# Patient Record
Sex: Female | Born: 1950 | Race: White | Hispanic: No | Smoking: Never smoker
Health system: Southern US, Community
[De-identification: ages and names within clinical notes are randomized; demographics above are authoritative.]

## PROBLEM LIST (undated history)

## (undated) DIAGNOSIS — N182 Chronic kidney disease, stage 2 (mild): Secondary | ICD-10-CM

## (undated) DIAGNOSIS — T7840XA Allergy, unspecified, initial encounter: Secondary | ICD-10-CM

## (undated) DIAGNOSIS — I10 Essential (primary) hypertension: Secondary | ICD-10-CM

## (undated) DIAGNOSIS — K8020 Calculus of gallbladder without cholecystitis without obstruction: Secondary | ICD-10-CM

## (undated) DIAGNOSIS — D259 Leiomyoma of uterus, unspecified: Secondary | ICD-10-CM

## (undated) DIAGNOSIS — C4491 Basal cell carcinoma of skin, unspecified: Secondary | ICD-10-CM

## (undated) DIAGNOSIS — R112 Nausea with vomiting, unspecified: Secondary | ICD-10-CM

## (undated) DIAGNOSIS — Z9889 Other specified postprocedural states: Secondary | ICD-10-CM

## (undated) DIAGNOSIS — O3410 Maternal care for benign tumor of corpus uteri, unspecified trimester: Secondary | ICD-10-CM

## (undated) DIAGNOSIS — K7689 Other specified diseases of liver: Secondary | ICD-10-CM

## (undated) DIAGNOSIS — I4891 Unspecified atrial fibrillation: Secondary | ICD-10-CM

## (undated) DIAGNOSIS — N200 Calculus of kidney: Secondary | ICD-10-CM

## (undated) DIAGNOSIS — I341 Nonrheumatic mitral (valve) prolapse: Secondary | ICD-10-CM

## (undated) DIAGNOSIS — H269 Unspecified cataract: Secondary | ICD-10-CM

## (undated) HISTORY — DX: Allergy, unspecified, initial encounter: T78.40XA

## (undated) HISTORY — PX: POLYPECTOMY: SHX149

## (undated) HISTORY — PX: SQUAMOUS CELL CARCINOMA EXCISION: SHX2433

## (undated) HISTORY — DX: Unspecified cataract: H26.9

## (undated) HISTORY — PX: MOHS SURGERY: SUR867

## (undated) HISTORY — DX: Nonrheumatic mitral (valve) prolapse: I34.1

## (undated) HISTORY — DX: Other specified postprocedural states: R11.2

## (undated) HISTORY — DX: Calculus of kidney: N20.0

## (undated) HISTORY — DX: Other specified postprocedural states: Z98.890

## (undated) HISTORY — DX: Calculus of gallbladder without cholecystitis without obstruction: K80.20

## (undated) HISTORY — DX: Basal cell carcinoma of skin, unspecified: C44.91

## (undated) HISTORY — DX: Unspecified atrial fibrillation: I48.91

## (undated) HISTORY — DX: Chronic kidney disease, stage 2 (mild): N18.2

## (undated) HISTORY — DX: Leiomyoma of uterus, unspecified: D25.9

## (undated) HISTORY — DX: Leiomyoma of uterus, unspecified: O34.10

## (undated) HISTORY — PX: OTHER SURGICAL HISTORY: SHX169

## (undated) HISTORY — DX: Other specified diseases of liver: K76.89

## (undated) HISTORY — DX: Essential (primary) hypertension: I10

---
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
## 1996-09-20 HISTORY — PX: CHOLECYSTECTOMY: SHX55

## 2005-10-15 ENCOUNTER — Encounter: Admission: RE | Admit: 2005-10-15 | Discharge: 2005-10-15 | Payer: Self-pay | Admitting: Family Medicine

## 2006-10-21 ENCOUNTER — Encounter: Admission: RE | Admit: 2006-10-21 | Discharge: 2006-10-21 | Payer: Self-pay | Admitting: Family Medicine

## 2008-02-02 ENCOUNTER — Encounter: Admission: RE | Admit: 2008-02-02 | Discharge: 2008-02-02 | Payer: Self-pay | Admitting: Family Medicine

## 2009-04-14 ENCOUNTER — Encounter: Admission: RE | Admit: 2009-04-14 | Discharge: 2009-04-14 | Payer: Self-pay | Admitting: Obstetrics and Gynecology

## 2010-05-15 ENCOUNTER — Encounter: Admission: RE | Admit: 2010-05-15 | Discharge: 2010-05-15 | Payer: Self-pay | Admitting: Obstetrics and Gynecology

## 2011-03-26 ENCOUNTER — Encounter: Payer: Self-pay | Admitting: Emergency Medicine

## 2011-03-26 ENCOUNTER — Ambulatory Visit (INDEPENDENT_AMBULATORY_CARE_PROVIDER_SITE_OTHER): Payer: BC Managed Care – PPO | Admitting: Emergency Medicine

## 2011-03-26 DIAGNOSIS — I10 Essential (primary) hypertension: Secondary | ICD-10-CM

## 2011-03-26 DIAGNOSIS — I341 Nonrheumatic mitral (valve) prolapse: Secondary | ICD-10-CM | POA: Insufficient documentation

## 2011-03-26 DIAGNOSIS — R053 Chronic cough: Secondary | ICD-10-CM | POA: Insufficient documentation

## 2011-03-26 DIAGNOSIS — I059 Rheumatic mitral valve disease, unspecified: Secondary | ICD-10-CM

## 2011-03-26 DIAGNOSIS — R05 Cough: Secondary | ICD-10-CM | POA: Insufficient documentation

## 2011-03-26 NOTE — Assessment & Plan Note (Signed)
Resolving off benazepril. Denies GERD or allergies. Suspect that it will resolve completely - Wear a mask with wood working and yard work - if cough returns we will consider PFT and further imaging - consider empiric rx for GER dor allergies if cough returns.

## 2011-03-26 NOTE — Progress Notes (Signed)
  Subjective:    Patient ID: Melissa Cole, female    DOB: 02-28-1951, 60 y.o.   MRN: [id number]  HPI 60 yo woman, never smoker, hx HTN, MVP. She has been well until mid April when she was exposed to wood dust (she is an Tree surgeon, does wood turning) then did [employer]. She experienced a rash as well, pruritis. She described burning with inspiration that lasted for 3 -4 days, lost her voice. Minimal cough, no mucous. She developed a cough over the next several days. Has continued to cough, dry, bothers her with exertion, not w sleep. She has not done any more woodwork since; she has done some yardwork since. Was treated with pred by derm and family med. Of note she was on benazepril at the time - recently changed back to maxzide. Her cough is improving but not quite gone. She denies any allergy sx or GERD.    Review of Systems  Respiratory: Positive for cough.   Skin: Positive for rash.       Objective:   Physical Exam  Gen: Pleasant, well-nourished, in no distress,  normal affect  ENT: No lesions,  mouth clear,  oropharynx clear, no postnasal drip  Neck: No JVD, no TMG, no carotid bruits  Lungs: No use of accessory muscles, no dullness to percussion, clear without rales or rhonchi  Cardiovascular: RRR, heart sounds normal, no murmur or gallops, no peripheral edema  Musculoskeletal: No deformities, no cyanosis or clubbing  Neuro: alert, non focal  Skin: Warm, no lesions or rashes      Past Medical History  Diagnosis Date  . Hypertension   . Mitral valve prolapse      Family History  Problem Relation Age of Onset  . Heart attack Mother   . Heart attack Brother   . Heart attack Brother   . Stomach cancer Maternal Grandmother   . Allergies Mother     food sensitivities     History   Social History  . Marital Status: Unknown    Spouse Name: N/A    Number of Children: N/A  . Years of Education: N/A   Occupational History  . dental assistant    Social History Main  Topics  . Smoking status: Never Smoker   . Smokeless tobacco: Not on file  . Alcohol Use: Yes     4-5 drinks per week  . Drug Use: No  . Sexually Active: Not on file   Other Topics Concern  . Not on file   Social History Narrative  . No narrative on file     Allergies  Allergen Reactions  . Codeine     Stomach pain   . Iodine Itching     No outpatient prescriptions prior to visit.      Assessment & Plan:  Chronic cough Resolving off benazepril. Denies GERD or allergies. Suspect that it will resolve completely - Wear a mask with wood working and yard work - if cough returns we will consider PFT and further imaging - consider empiric rx for GER dor allergies if cough returns.

## 2011-03-26 NOTE — Patient Instructions (Signed)
Wear a mask with wood working and yard work If your cough returns we will consider further imaging of the chest and breathing tests Call our office if your cough worsens or for any breathing issues.

## 2011-04-12 ENCOUNTER — Other Ambulatory Visit: Payer: Self-pay | Admitting: Chiropractic Medicine

## 2011-04-12 DIAGNOSIS — M25511 Pain in right shoulder: Secondary | ICD-10-CM

## 2011-04-13 ENCOUNTER — Other Ambulatory Visit: Payer: BC Managed Care – PPO

## 2011-04-27 ENCOUNTER — Other Ambulatory Visit: Payer: Self-pay | Admitting: Chiropractic Medicine

## 2011-04-27 DIAGNOSIS — Z139 Encounter for screening, unspecified: Secondary | ICD-10-CM

## 2011-04-28 ENCOUNTER — Other Ambulatory Visit: Payer: BC Managed Care – PPO

## 2011-04-28 ENCOUNTER — Ambulatory Visit
Admission: RE | Admit: 2011-04-28 | Discharge: 2011-04-28 | Disposition: A | Discharge status: Home / Self Care | Payer: BC Managed Care – PPO | Source: Ambulatory Visit | Attending: Chiropractic Medicine | Admitting: Chiropractic Medicine

## 2011-04-28 DIAGNOSIS — M25511 Pain in right shoulder: Secondary | ICD-10-CM

## 2011-04-28 DIAGNOSIS — Z139 Encounter for screening, unspecified: Secondary | ICD-10-CM

## 2011-06-30 ENCOUNTER — Other Ambulatory Visit: Payer: Self-pay | Admitting: Obstetrics and Gynecology

## 2011-06-30 DIAGNOSIS — Z1231 Encounter for screening mammogram for malignant neoplasm of breast: Secondary | ICD-10-CM

## 2011-07-16 ENCOUNTER — Ambulatory Visit
Admission: RE | Admit: 2011-07-16 | Discharge: 2011-07-16 | Disposition: A | Discharge status: Home / Self Care | Payer: BC Managed Care – PPO | Source: Ambulatory Visit | Attending: Obstetrics and Gynecology | Admitting: Obstetrics and Gynecology

## 2011-07-16 DIAGNOSIS — Z1231 Encounter for screening mammogram for malignant neoplasm of breast: Secondary | ICD-10-CM

## 2011-10-29 ENCOUNTER — Emergency Department (HOSPITAL_COMMUNITY): Payer: BC Managed Care – PPO

## 2011-10-29 ENCOUNTER — Emergency Department (HOSPITAL_COMMUNITY)
Admission: EM | Admit: 2011-10-29 | Discharge: 2011-10-29 | Disposition: A | Discharge status: Home / Self Care | Payer: BC Managed Care – PPO | Attending: Emergency Medicine | Admitting: Emergency Medicine

## 2011-10-29 ENCOUNTER — Encounter (HOSPITAL_COMMUNITY): Payer: Self-pay | Admitting: Emergency Medicine

## 2011-10-29 DIAGNOSIS — S60229A Contusion of unspecified hand, initial encounter: Secondary | ICD-10-CM | POA: Insufficient documentation

## 2011-10-29 DIAGNOSIS — W19XXXA Unspecified fall, initial encounter: Secondary | ICD-10-CM

## 2011-10-29 DIAGNOSIS — M25539 Pain in unspecified wrist: Secondary | ICD-10-CM | POA: Insufficient documentation

## 2011-10-29 DIAGNOSIS — W010XXA Fall on same level from slipping, tripping and stumbling without subsequent striking against object, initial encounter: Secondary | ICD-10-CM | POA: Insufficient documentation

## 2011-10-29 DIAGNOSIS — I10 Essential (primary) hypertension: Secondary | ICD-10-CM | POA: Insufficient documentation

## 2011-10-29 DIAGNOSIS — M7989 Other specified soft tissue disorders: Secondary | ICD-10-CM | POA: Insufficient documentation

## 2011-10-29 DIAGNOSIS — M79609 Pain in unspecified limb: Secondary | ICD-10-CM | POA: Insufficient documentation

## 2011-10-29 DIAGNOSIS — Z79899 Other long term (current) drug therapy: Secondary | ICD-10-CM | POA: Insufficient documentation

## 2011-10-29 MED ORDER — FENTANYL CITRATE 0.05 MG/ML IJ SOLN
INTRAMUSCULAR | Status: AC
  Administered 2011-10-29: 50 ug via INTRAVENOUS
  Filled 2011-10-29: qty 2

## 2011-10-29 MED ORDER — HYDROMORPHONE HCL PF 1 MG/ML IJ SOLN
INTRAMUSCULAR | Status: AC
  Administered 2011-10-29: 1 mg via INTRAVENOUS
  Filled 2011-10-29: qty 1

## 2011-10-29 MED ORDER — PROMETHAZINE HCL 25 MG/ML IJ SOLN: 12.5000 mg | INTRAMUSCULAR | Status: DC | PRN

## 2011-10-29 MED ORDER — ONDANSETRON HCL 4 MG/2ML IJ SOLN
4.0000 mg | Freq: Once | INTRAMUSCULAR | Status: AC
  Administered 2011-10-29: 4 mg via INTRAVENOUS

## 2011-10-29 MED ORDER — ONDANSETRON HCL 4 MG/2ML IJ SOLN
INTRAMUSCULAR | Status: AC
  Filled 2011-10-29: qty 2

## 2011-10-29 MED ORDER — FENTANYL CITRATE 0.05 MG/ML IJ SOLN
100.0000 ug | Freq: Once | INTRAMUSCULAR | Status: AC
  Administered 2011-10-29: 50 ug via INTRAVENOUS

## 2011-10-29 MED ORDER — ONDANSETRON HCL 4 MG/2ML IJ SOLN
INTRAMUSCULAR | Status: AC
  Administered 2011-10-29: 4 mg via INTRAVENOUS
  Filled 2011-10-29: qty 2

## 2011-10-29 MED ORDER — HYDROMORPHONE HCL PF 1 MG/ML IJ SOLN
1.0000 mg | Freq: Once | INTRAMUSCULAR | Status: AC
  Administered 2011-10-29: 1 mg via INTRAVENOUS

## 2011-10-29 MED ORDER — PROMETHAZINE HCL 25 MG/ML IJ SOLN
12.5000 mg | INTRAMUSCULAR | Status: AC
  Administered 2011-10-29: 12.5 mg via INTRAVENOUS
  Filled 2011-10-29: qty 1

## 2011-10-29 MED ORDER — OXYCODONE-ACETAMINOPHEN 5-325 MG PO TABS: 2.0000 | ORAL_TABLET | ORAL | Status: AC | PRN

## 2011-10-29 MED ORDER — ONDANSETRON HCL 4 MG PO TABS: 4.0000 mg | ORAL_TABLET | Freq: Four times a day (QID) | ORAL | Status: AC

## 2011-10-29 NOTE — ED Notes (Signed)
Pt c/o left wrist and arm pain after tripping today; pt sts heard crack; swelling noted

## 2011-10-29 NOTE — ED Provider Notes (Signed)
History     CSN: [id number]  Arrival date & time 10/29/11  1631   First MD Initiated Contact with Patient 10/29/11 1708      Chief Complaint  Patient presents with  . Wrist Injury  . Fall     HPI Pt c/o left wrist and arm pain after tripping today; pt sts heard crack; swelling noted     Past Medical History  Diagnosis Date  . Hypertension   . Mitral valve prolapse     Past Surgical History  Procedure Date  . Cholecystectomy 1998  . Mitral valve repair     Family History  Problem Relation Age of Onset  . Heart attack Mother   . Heart attack Brother   . Heart attack Brother   . Stomach cancer Maternal Grandmother   . Allergies Mother     food sensitivities    History  Substance Use Topics  . Smoking status: Never Smoker   . Smokeless tobacco: Not on file  . Alcohol Use: Yes     4-5 drinks per week    OB History    Grav Para Term Preterm Abortions TAB SAB Ect Mult Living                  Review of Systems Negative except as noted on history of present illness Allergies  Codeine; Iodine; and Mushroom ext cmplx(shiitake-reishi-mait)  Home Medications   Current Outpatient Rx  Name Route Sig Dispense Refill  . ACTIVELLA 1-0.5 MG PO TABS Oral Take 1 tablet by mouth daily. Once daily    . OMEGA-3 FATTY ACIDS 1000 MG PO CAPS Oral Take 2 g by mouth daily. 2 by mouth daily    . POTASSIUM 99 MG PO TABS Oral Take 1 tablet by mouth daily as needed. As needed    . TRIAMTERENE-HCTZ 75-50 MG PO TABS Oral Take 1 tablet by mouth daily. Once daily    . ONDANSETRON HCL 4 MG PO TABS Oral Take 1 tablet (4 mg total) by mouth every 6 (six) hours. 12 tablet 0  . OXYCODONE-ACETAMINOPHEN 5-325 MG PO TABS Oral Take 2 tablets by mouth every 4 (four) hours as needed for pain. 20 tablet 0    BP 170/106  Pulse 115  Temp(Src) 98.1 F (36.7 C) (Oral)  Resp 18  SpO2 98%  Physical Exam  Nursing note and vitals reviewed. Constitutional: She is oriented to person, place, and  time. She appears well-developed and well-nourished. No distress.  HENT:  Head: Normocephalic and atraumatic.  Eyes: Pupils are equal, round, and reactive to light.  Neck: Normal range of motion.  Cardiovascular: Normal rate and intact distal pulses.   Pulmonary/Chest: No respiratory distress.  Abdominal: Normal appearance. She exhibits no distension.  Musculoskeletal:       Left hand: She exhibits decreased range of motion, tenderness, bony tenderness and swelling. She exhibits no deformity and no laceration.       Hands: Neurological: She is alert and oriented to person, place, and time. No cranial nerve deficit.  Skin: Skin is warm and dry. No rash noted.  Psychiatric: She has a normal mood and affect. Her behavior is normal.    ED Course  Procedures (including critical care time)  Labs Reviewed - No data to display Dg Wrist Complete Left  10/29/2011  *RADIOLOGY REPORT*  Clinical Data: Wrist injury post fall  LEFT WRIST - COMPLETE 3+ VIEW  Comparison: None.  Findings: Four views of the left wrist submitted.  No  acute fracture or subluxation.  Probable accessory ossicle noted adjacent to ulnar styloid.  Mild degenerative narrowing of radiocarpal joint space.  IMPRESSION: No acute fracture or subluxation.  Mild degenerative changes.  Original Report Authenticated By: Natasha Mead, M.D.   Dg Hand Complete Left  10/29/2011  *RADIOLOGY REPORT*  Clinical Data: Wrist injury post fall  LEFT HAND - COMPLETE 3+ VIEW  Comparison: None  Findings: Three views of the left hand submitted.  No acute fracture or subluxation.  Degenerative changes are noted distal interphalangeal joints.  IMPRESSION: No acute fracture or subluxation.  Degenerative changes distal interphalangeal joints.  Original Report Authenticated By: Natasha Mead, M.D.     1. Fall   2. Hand contusion       MDM          Nelia Shi, MD 10/29/11 [phone]

## 2011-10-29 NOTE — ED Notes (Signed)
Attempted to get patient up to the bathroom.  Became nauseated and laid back down.

## 2011-10-29 NOTE — Progress Notes (Signed)
Orthopedic Tech Progress Note Patient Details:  Melissa Cole 01-15-1951 [id number]  Type of Splint: Other (comment) (splint wrist lt/rt) Splint Location: left wrist Splint Interventions: Application  velcro wrist splint  Nikki Dom 10/29/2011, 7:42 PM

## 2011-10-29 NOTE — ED Notes (Signed)
Asked provider for pain medication order, pt requesting medication other than dilaudid as she had N/V with original dose

## 2011-10-29 NOTE — ED Notes (Addendum)
Pt got tangled in dog leash and fell forward catching self with hands.  Heard wrist pop and wrist was bent forward toward palm.  Pt snapped hand back into place.  Pulses 2+ fingers warm and sensation normal

## 2012-07-24 ENCOUNTER — Other Ambulatory Visit: Payer: Self-pay | Admitting: Obstetrics and Gynecology

## 2012-07-24 DIAGNOSIS — Z1231 Encounter for screening mammogram for malignant neoplasm of breast: Secondary | ICD-10-CM

## 2012-07-28 ENCOUNTER — Ambulatory Visit
Admission: RE | Admit: 2012-07-28 | Discharge: 2012-07-28 | Disposition: A | Discharge status: Home / Self Care | Payer: BC Managed Care – PPO | Source: Ambulatory Visit | Attending: Obstetrics and Gynecology | Admitting: Obstetrics and Gynecology

## 2012-07-28 DIAGNOSIS — Z1231 Encounter for screening mammogram for malignant neoplasm of breast: Secondary | ICD-10-CM

## 2013-08-10 ENCOUNTER — Other Ambulatory Visit: Payer: Self-pay

## 2013-08-10 DIAGNOSIS — Z1231 Encounter for screening mammogram for malignant neoplasm of breast: Secondary | ICD-10-CM

## 2013-09-12 ENCOUNTER — Ambulatory Visit
Admission: RE | Admit: 2013-09-12 | Discharge: 2013-09-12 | Disposition: A | Discharge status: Home / Self Care | Payer: BC Managed Care – PPO | Source: Ambulatory Visit

## 2013-09-12 DIAGNOSIS — Z1231 Encounter for screening mammogram for malignant neoplasm of breast: Secondary | ICD-10-CM

## 2014-01-23 ENCOUNTER — Emergency Department (HOSPITAL_COMMUNITY)
Admission: EM | Admit: 2014-01-23 | Discharge: 2014-01-24 | Disposition: A | Discharge status: Home / Self Care | Payer: BC Managed Care – PPO | Attending: Emergency Medicine | Admitting: Emergency Medicine

## 2014-01-23 ENCOUNTER — Encounter (HOSPITAL_COMMUNITY): Payer: Self-pay | Admitting: Emergency Medicine

## 2014-01-23 DIAGNOSIS — I10 Essential (primary) hypertension: Secondary | ICD-10-CM | POA: Insufficient documentation

## 2014-01-23 DIAGNOSIS — N200 Calculus of kidney: Secondary | ICD-10-CM | POA: Insufficient documentation

## 2014-01-23 DIAGNOSIS — Z79899 Other long term (current) drug therapy: Secondary | ICD-10-CM | POA: Insufficient documentation

## 2014-01-23 MED ORDER — ONDANSETRON HCL 4 MG/2ML IJ SOLN
4.0000 mg | Freq: Once | INTRAMUSCULAR | Status: AC
  Administered 2014-01-24: 4 mg via INTRAVENOUS
  Filled 2014-01-23: qty 2

## 2014-01-23 MED ORDER — MORPHINE SULFATE 4 MG/ML IJ SOLN
4.0000 mg | Freq: Once | INTRAMUSCULAR | Status: AC
  Administered 2014-01-24: 4 mg via INTRAVENOUS
  Filled 2014-01-23: qty 1

## 2014-01-23 NOTE — ED Notes (Signed)
Pt stated that she took tylenol at about 9pm.

## 2014-01-23 NOTE — ED Notes (Signed)
Pt was assessed with a C/O abdominal pain,a scale of 10 (0-10). Vs 153./72 Sp02 99% P86, alert and orientedx4.

## 2014-01-24 ENCOUNTER — Emergency Department (HOSPITAL_COMMUNITY): Payer: BC Managed Care – PPO

## 2014-01-24 ENCOUNTER — Encounter (HOSPITAL_COMMUNITY): Payer: Self-pay | Admitting: Radiology

## 2014-01-24 LAB — COMPREHENSIVE METABOLIC PANEL
ALBUMIN: 3.8 g/dL (ref 3.5–5.2)
ALT: 34 U/L (ref 0–35)
AST: 25 U/L (ref 0–37)
Alkaline Phosphatase: 42 U/L (ref 39–117)
BILIRUBIN TOTAL: 0.3 mg/dL (ref 0.3–1.2)
BUN: 17 mg/dL (ref 6–23)
CO2: 25 mEq/L (ref 19–32)
CREATININE: 1.02 mg/dL (ref 0.50–1.10)
Calcium: 9.2 mg/dL (ref 8.4–10.5)
Chloride: 101 mEq/L (ref 96–112)
GFR calc Af Amer: 67 mL/min — ABNORMAL LOW (ref >90)
GFR calc non Af Amer: 58 mL/min — ABNORMAL LOW (ref >90)
GLUCOSE: 124 mg/dL — AB (ref 70–99)
Potassium: 3.5 mEq/L — ABNORMAL LOW (ref 3.7–5.3)
Sodium: 138 mEq/L (ref 137–147)
TOTAL PROTEIN: 6.8 g/dL (ref 6.0–8.3)

## 2014-01-24 LAB — URINE MICROSCOPIC-ADD ON

## 2014-01-24 LAB — CBC WITH DIFFERENTIAL/PLATELET
BASOS PCT: 0 % (ref 0–1)
Basophils Absolute: 0 10*3/uL (ref 0.0–0.1)
EOS PCT: 0 % (ref 0–5)
Eosinophils Absolute: 0 10*3/uL (ref 0.0–0.7)
HCT: 39.2 % (ref 36.0–46.0)
Hemoglobin: 13.5 g/dL (ref 12.0–15.0)
Lymphocytes Relative: 12 % (ref 12–46)
Lymphs Abs: 1.1 10*3/uL (ref 0.7–4.0)
MCH: 31.5 pg (ref 26.0–34.0)
MCHC: 34.4 g/dL (ref 30.0–36.0)
MCV: 91.6 fL (ref 78.0–100.0)
MONO ABS: 0.5 10*3/uL (ref 0.1–1.0)
Monocytes Relative: 5 % (ref 3–12)
Neutro Abs: 7.9 10*3/uL — ABNORMAL HIGH (ref 1.7–7.7)
Neutrophils Relative %: 83 % — ABNORMAL HIGH (ref 43–77)
Platelets: 234 10*3/uL (ref 150–400)
RBC: 4.28 MIL/uL (ref 3.87–5.11)
RDW: 12.6 % (ref 11.5–15.5)
WBC: 9.6 10*3/uL (ref 4.0–10.5)

## 2014-01-24 LAB — URINALYSIS, ROUTINE W REFLEX MICROSCOPIC
Bilirubin Urine: NEGATIVE
Glucose, UA: NEGATIVE mg/dL
Hgb urine dipstick: NEGATIVE
KETONES UR: 15 mg/dL — AB
NITRITE: NEGATIVE
PH: 7 (ref 5.0–8.0)
Protein, ur: NEGATIVE mg/dL
SPECIFIC GRAVITY, URINE: 1.017 (ref 1.005–1.030)
Urobilinogen, UA: 0.2 mg/dL (ref 0.0–1.0)

## 2014-01-24 LAB — LIPASE, BLOOD: Lipase: 31 U/L (ref 11–59)

## 2014-01-24 MED ORDER — ONDANSETRON 4 MG PO TBDP
8.0000 mg | ORAL_TABLET | Freq: Once | ORAL | Status: AC
  Administered 2014-01-24: 8 mg via ORAL
  Filled 2014-01-24: qty 2

## 2014-01-24 MED ORDER — IOHEXOL 300 MG/ML  SOLN
25.0000 mL | Freq: Once | INTRAMUSCULAR | Status: AC | PRN
  Administered 2014-01-24: 25 mL via ORAL

## 2014-01-24 MED ORDER — TAMSULOSIN HCL 0.4 MG PO CAPS: 0.4000 mg | ORAL_CAPSULE | Freq: Every day | ORAL | Status: DC

## 2014-01-24 MED ORDER — SULFAMETHOXAZOLE-TMP DS 800-160 MG PO TABS: 1.0000 | ORAL_TABLET | Freq: Two times a day (BID) | ORAL | Status: DC

## 2014-01-24 MED ORDER — OXYCODONE-ACETAMINOPHEN 5-325 MG PO TABS: 2.0000 | ORAL_TABLET | ORAL | Status: DC | PRN

## 2014-01-24 MED ORDER — SODIUM CHLORIDE 0.9 % IV BOLUS (SEPSIS)
1000.0000 mL | Freq: Once | INTRAVENOUS | Status: AC
  Administered 2014-01-24: 1000 mL via INTRAVENOUS

## 2014-01-24 MED ORDER — PANTOPRAZOLE SODIUM 40 MG PO TBEC: 40.0000 mg | DELAYED_RELEASE_TABLET | Freq: Every day | ORAL | Status: DC

## 2014-01-24 MED ORDER — MORPHINE SULFATE 4 MG/ML IJ SOLN
4.0000 mg | Freq: Once | INTRAMUSCULAR | Status: AC
  Administered 2014-01-24: 4 mg via INTRAVENOUS
  Filled 2014-01-24: qty 1

## 2014-01-24 MED ORDER — ONDANSETRON HCL 4 MG/2ML IJ SOLN
4.0000 mg | Freq: Once | INTRAMUSCULAR | Status: AC
  Administered 2014-01-24: 4 mg via INTRAVENOUS
  Filled 2014-01-24: qty 2

## 2014-01-24 MED ORDER — IOHEXOL 300 MG/ML  SOLN
100.0000 mL | Freq: Once | INTRAMUSCULAR | Status: AC | PRN
  Administered 2014-01-24: 100 mL via INTRAVENOUS

## 2014-01-24 MED ORDER — OXYCODONE-ACETAMINOPHEN 5-325 MG PO TABS
2.0000 | ORAL_TABLET | Freq: Once | ORAL | Status: AC
  Administered 2014-01-24: 2 via ORAL
  Filled 2014-01-24: qty 2

## 2014-01-24 MED ORDER — ONDANSETRON 8 MG PO TBDP: 8.0000 mg | ORAL_TABLET | Freq: Three times a day (TID) | ORAL | Status: DC | PRN

## 2014-01-24 NOTE — ED Notes (Signed)
Returned from CT.

## 2014-01-24 NOTE — ED Provider Notes (Signed)
CSN: [id number]     Arrival date & time 01/23/14  2242 History   First MD Initiated Contact with Patient 01/23/14 2254     Chief Complaint  Patient presents with  . Abdominal Pain     (Consider location/radiation/quality/duration/timing/severity/associated sxs/prior Treatment) HPI 63 year old female presents to emergency room from home with acute onset of left sided abdominal pain with radiation throughout her entire abdomen starting at 9 PM.  Pain is sharp constant crampy in nature.  No prior history of same.  Patient had gallbladder removed in 98, no other prior surgeries.  She reports nausea vomiting and diarrhea.  No fevers. Past Medical History  Diagnosis Date  . Hypertension   . Mitral valve prolapse    Past Surgical History  Procedure Laterality Date  . Cholecystectomy  1998  . Mitral valve repair     Family History  Problem Relation Age of Onset  . Heart attack Mother   . Heart attack Brother   . Heart attack Brother   . Stomach cancer Maternal Grandmother   . Allergies Mother     food sensitivities   History  Substance Use Topics  . Smoking status: Never Smoker   . Smokeless tobacco: Not on file  . Alcohol Use: Yes     Comment: 4-5 drinks per week   OB History   Grav Para Term Preterm Abortions TAB SAB Ect Mult Living                 Review of Systems   See History of Present Illness; otherwise all other systems are reviewed and negative  Allergies  Codeine; Iodine; and Mushroom ext cmplx(shiitake-reishi-mait)  Home Medications   Prior to Admission medications   Medication Sig Start Date End Date Taking? Authorizing Provider  ACTIVELLA 1-0.5 MG per tablet Take 1 tablet by mouth daily. Once daily 03/18/11  Yes Historical Provider, MD  Coenzyme Q10 (COQ-10 PO) Take 1 tablet by mouth daily.   Yes Historical Provider, MD  OVER THE COUNTER MEDICATION Take 1 capsule by mouth daily. Ultimate Oil (fish oil supplement)   Yes Historical Provider, MD  pantoprazole  (PROTONIX) 40 MG tablet Take 40 mg by mouth daily.   Yes Historical Provider, MD  triamterene-hydrochlorothiazide (MAXZIDE) 75-50 MG per tablet Take 1 tablet by mouth daily. Once daily 03/19/11  Yes Historical Provider, MD  Vitamin D, Cholecalciferol, 400 UNITS CHEW Chew 1 tablet by mouth daily.   Yes Historical Provider, MD   BP 153/72  Pulse 86  Temp(Src) 98.6 F (37 C) (Oral)  Ht 5\' 4"  (1.626 m)  Wt 160 lb (72.576 kg)  BMI 27.45 kg/m2  SpO2 99% Physical Exam  Nursing note and vitals reviewed. Constitutional: She is oriented to person, place, and time. She appears well-developed and well-nourished. She appears distressed.  HENT:  Head: Normocephalic and atraumatic.  Nose: Nose normal.  Mouth/Throat: Oropharynx is clear and moist.  Eyes: Conjunctivae and EOM are normal. Pupils are equal, round, and reactive to light.  Neck: Normal range of motion. Neck supple. No JVD present. No tracheal deviation present. No thyromegaly present.  Cardiovascular: Normal rate, regular rhythm, normal heart sounds and intact distal pulses.  Exam reveals no gallop and no friction rub.   No murmur heard. Pulmonary/Chest: Effort normal and breath sounds normal. No stridor. No respiratory distress. She has no wheezes. She has no rales. She exhibits no tenderness.  Abdominal: Soft. Bowel sounds are normal. She exhibits no distension and no mass. There is tenderness (patient  has significant tenderness in epigastrium along left abdominal region.  No rebound no guarding). There is no rebound and no guarding.  Musculoskeletal: Normal range of motion. She exhibits no edema and no tenderness.  Lymphadenopathy:    She has no cervical adenopathy.  Neurological: She is alert and oriented to person, place, and time. She exhibits normal muscle tone. Coordination normal.  Skin: Skin is warm and dry. No rash noted. No erythema. No pallor.  Psychiatric: She has a normal mood and affect. Her behavior is normal. Judgment and  thought content normal.    ED Course  Procedures (including critical care time) Labs Review Labs Reviewed  CBC WITH DIFFERENTIAL - Abnormal; Notable for the following:    Neutrophils Relative % 83 (*)    Neutro Abs 7.9 (*)    All other components within normal limits  COMPREHENSIVE METABOLIC PANEL - Abnormal; Notable for the following:    Potassium 3.5 (*)    Glucose, Bld 124 (*)    GFR calc non Af Amer 58 (*)    GFR calc Af Amer 67 (*)    All other components within normal limits  URINALYSIS, ROUTINE W REFLEX MICROSCOPIC - Abnormal; Notable for the following:    Ketones, ur 15 (*)    Leukocytes, UA MODERATE (*)    All other components within normal limits  URINE CULTURE  LIPASE, BLOOD  URINE MICROSCOPIC-ADD ON    Imaging Review Ct Abdomen Pelvis W Contrast  01/24/2014   CLINICAL DATA:  Abdominal pain, left-sided, acute onset  EXAM: CT ABDOMEN AND PELVIS WITH CONTRAST  TECHNIQUE: Multidetector CT imaging of the abdomen and pelvis was performed using the standard protocol following bolus administration of intravenous contrast.  CONTRAST:  158mL OMNIPAQUE IOHEXOL 300 MG/ML  SOLN  COMPARISON:  None.  FINDINGS: Exam being read in incomplete status to accelerate patient care.  BODY WALL: Unremarkable.  LOWER CHEST: RCA atherosclerosis.  Dependent atelectasis, mild.  ABDOMEN/PELVIS:  Liver: 2 hepatic cysts, the larger in the central right liver measuring up did with 33 mm.  Biliary: Cholecystectomy.  Pancreas: Unremarkable.  Spleen: Unremarkable.  Adrenals: Unremarkable.  Kidneys and ureters: Moderate left hydroureteronephrosis secondary to a 2 mm stone in the distal left ureter, at the ureteral vesicular junction. There may be an additional stone at the base of the bladder. Moderate left perinephric edema, likely from fornixrupture. No intrarenal calculus. No evidence of solid renal mass.  Bladder: Possible punctate stone at the base of the bladder.  Reproductive: At least 3 uterine fibroids in  the fundus, 2 which are pedunculated. The fibroids measure approximately 1 cm.  Bowel: No obstruction. No pericecal inflammation.  Retroperitoneum: No mass or adenopathy.  Peritoneum: No free fluid or gas.  Vascular: No acute abnormality.  OSSEOUS: No acute abnormalities. Focally advanced L2-3 degenerative disc disease with disc protrusion narrowing the ventral thecal sac.  IMPRESSION: 1. Obstructing 2 mm stone at the left ureteral vesicular junction. 2. Small uterine fibroids.   Electronically Signed   By: Jorje Guild M.D.   On: 01/24/2014 05:13     EKG Interpretation None      MDM   Final diagnoses:  Kidney stone on left side    63 year old female with acute onset of left-sided abdominal pain radiation into her epigastrium.  The plan for labs, pain and nausea medicine.  May need CT scan, we'll let labs help with decision for imaging.    Kalman Drape, MD 01/24/14 [phone]

## 2014-01-24 NOTE — ED Notes (Signed)
Pt states that she feels better, to be d/c to home,pt called daughter to come pick her up. Pt alert and orientedx4

## 2014-01-24 NOTE — ED Notes (Signed)
Patient transported to CT 

## 2014-01-24 NOTE — Discharge Instructions (Signed)
You have a 2 mm stone in your left ureter, about to pass into your bladder.  It appears you have a second stone already passed into your bladder!  Take medications as prescribed.  Use strainer as instructed.  Contact the urology clinic today to scheduled follow up.  Stones less than 6 mm are usually able to pass on their own.     Kidney Stones Kidney stones (urolithiasis) are deposits that form inside your kidneys. The intense pain is caused by the stone moving through the urinary tract. When the stone moves, the ureter goes into spasm around the stone. The stone is usually passed in the urine.  CAUSES   A disorder that makes certain neck glands produce too much parathyroid hormone (primary hyperparathyroidism).  A buildup of uric acid crystals, similar to gout in your joints.  Narrowing (stricture) of the ureter.  A kidney obstruction present at birth (congenital obstruction).  Previous surgery on the kidney or ureters.  Numerous kidney infections. SYMPTOMS   Feeling sick to your stomach (nauseous).  Throwing up (vomiting).  Blood in the urine (hematuria).  Pain that usually spreads (radiates) to the groin.  Frequency or urgency of urination. DIAGNOSIS   Taking a history and physical exam.  Blood or urine tests.  CT scan.  Occasionally, an examination of the inside of the urinary bladder (cystoscopy) is performed. TREATMENT   Observation.  Increasing your fluid intake.  Extracorporeal shock wave lithotripsy This is a noninvasive procedure that uses shock waves to break up kidney stones.  Surgery may be needed if you have severe pain or persistent obstruction. There are various surgical procedures. Most of the procedures are performed with the use of small instruments. Only small incisions are needed to accommodate these instruments, so recovery time is minimized. The size, location, and chemical composition are all important variables that will determine the proper  choice of action for you. Talk to your health care provider to better understand your situation so that you will minimize the risk of injury to yourself and your kidney.  HOME CARE INSTRUCTIONS   Drink enough water and fluids to keep your urine clear or pale yellow. This will help you to pass the stone or stone fragments.  Strain all urine through the provided strainer. Keep all particulate matter and stones for your health care provider to see. The stone causing the pain may be as small as a grain of salt. It is very important to use the strainer each and every time you pass your urine. The collection of your stone will allow your health care provider to analyze it and verify that a stone has actually passed. The stone analysis will often identify what you can do to reduce the incidence of recurrences.  Only take over-the-counter or prescription medicines for pain, discomfort, or fever as directed by your health care provider.  Make a follow-up appointment with your health care provider as directed.  Get follow-up X-rays if required. The absence of pain does not always mean that the stone has passed. It may have only stopped moving. If the urine remains completely obstructed, it can cause loss of kidney function or even complete destruction of the kidney. It is your responsibility to make sure X-rays and follow-ups are completed. Ultrasounds of the kidney can show blockages and the status of the kidney. Ultrasounds are not associated with any radiation and can be performed easily in a matter of minutes. SEEK MEDICAL CARE IF:  You experience pain that is  progressive and unresponsive to any pain medicine you have been prescribed. SEEK IMMEDIATE MEDICAL CARE IF:   Pain cannot be controlled with the prescribed medicine.  You have a fever or shaking chills.  The severity or intensity of pain increases over 18 hours and is not relieved by pain medicine.  You develop a new onset of abdominal  pain.  You feel faint or pass out.  You are unable to urinate. MAKE SURE YOU:   Understand these instructions.  Will watch your condition.  Will get help right away if you are not doing well or get worse. Document Released: 09/06/2005 Document Revised: 05/09/2013 Document Reviewed: 02/07/2013 [facility] Patient Information 2014 [place].  Urine Strainer This strainer is used to catch or filter out any stones found in your urine. Place the strainer under your urine stream. Save any stones or objects that you find in your urine. Place them in a plastic or glass container to show your caregiver. The stones vary in size - some can be very small, so make sure you check the strainer carefully. Your caregiver may send the stone to the lab. When the results are back, your caregiver may recommend medicines or diet changes.  Document Released: 06/11/2004 Document Revised: 11/29/2011 Document Reviewed: 07/19/2008 [facility] Patient Information 2014 [place].

## 2014-01-24 NOTE — ED Notes (Signed)
Pt was d/c, requested Michelene Heady MD. For pain medication, 2 tablets of percocet and 8mg  of Zofran given PO after d/c.No distress noted. Pt d/c to family.

## 2014-01-25 LAB — URINE CULTURE
COLONY COUNT: NO GROWTH
CULTURE: NO GROWTH
SPECIAL REQUESTS: NORMAL

## 2014-08-09 ENCOUNTER — Other Ambulatory Visit: Payer: Self-pay

## 2014-08-09 DIAGNOSIS — Z1231 Encounter for screening mammogram for malignant neoplasm of breast: Secondary | ICD-10-CM

## 2014-08-30 ENCOUNTER — Encounter: Payer: Self-pay | Admitting: Internal Medicine

## 2014-09-16 ENCOUNTER — Ambulatory Visit
Admission: RE | Admit: 2014-09-16 | Discharge: 2014-09-16 | Disposition: A | Discharge status: Home / Self Care | Payer: BC Managed Care – PPO | Source: Ambulatory Visit

## 2014-09-16 DIAGNOSIS — Z1231 Encounter for screening mammogram for malignant neoplasm of breast: Secondary | ICD-10-CM

## 2014-09-18 ENCOUNTER — Other Ambulatory Visit: Payer: Self-pay | Admitting: Obstetrics and Gynecology

## 2014-09-18 DIAGNOSIS — R928 Other abnormal and inconclusive findings on diagnostic imaging of breast: Secondary | ICD-10-CM

## 2014-10-01 ENCOUNTER — Ambulatory Visit
Admission: RE | Admit: 2014-10-01 | Discharge: 2014-10-01 | Disposition: A | Discharge status: Home / Self Care | Payer: BC Managed Care – PPO | Source: Ambulatory Visit | Attending: Obstetrics and Gynecology | Admitting: Obstetrics and Gynecology

## 2014-10-01 DIAGNOSIS — R928 Other abnormal and inconclusive findings on diagnostic imaging of breast: Secondary | ICD-10-CM

## 2014-11-06 ENCOUNTER — Encounter: Payer: Self-pay | Admitting: *Deleted

## 2014-11-18 ENCOUNTER — Encounter: Payer: BC Managed Care – PPO | Admitting: Internal Medicine

## 2014-11-19 ENCOUNTER — Other Ambulatory Visit (INDEPENDENT_AMBULATORY_CARE_PROVIDER_SITE_OTHER): Payer: BC Managed Care – PPO

## 2014-11-19 ENCOUNTER — Ambulatory Visit (INDEPENDENT_AMBULATORY_CARE_PROVIDER_SITE_OTHER): Payer: BC Managed Care – PPO | Admitting: Internal Medicine

## 2014-11-19 ENCOUNTER — Encounter: Payer: Self-pay | Admitting: Internal Medicine

## 2014-11-19 VITALS — BP 150/90 | HR 96 | Ht 63.25 in | Wt 141.2 lb

## 2014-11-19 DIAGNOSIS — R0989 Other specified symptoms and signs involving the circulatory and respiratory systems: Secondary | ICD-10-CM

## 2014-11-19 DIAGNOSIS — Z1211 Encounter for screening for malignant neoplasm of colon: Secondary | ICD-10-CM

## 2014-11-19 DIAGNOSIS — F458 Other somatoform disorders: Secondary | ICD-10-CM

## 2014-11-19 LAB — IGA: IgA: 159 mg/dL (ref 68–378)

## 2014-11-19 NOTE — Patient Instructions (Addendum)
You have been scheduled for an endoscopy and colonoscopy. Please follow the written instructions given to you at your visit today. Please pick up your prep supplies at the pharmacy within the next 1-3 days. If you use inhalers (even only as needed), please bring them with you on the day of your procedure. Your physician has requested that you go to www.startemmi.com and enter the access code given to you at your visit today. This web site gives a general overview about your procedure. However, you should still follow specific instructions given to you by our office regarding your preparation for the procedure.  Your physician has requested that you go to the basement for the following lab work before leaving today: IgA, TTG  CC:Dr [employer]

## 2014-11-19 NOTE — Progress Notes (Signed)
Patient ID: Melissa Cole, female   DOB: 08-25-1951, 64 y.o.   MRN: [id number] HPI: Melissa Cole is a 64 yo female with PMH of kidney stones, hypertension, mitral valve prolapse who seen in consultation at the request of Dr. Joylene Draft to evaluate globus sensation and for colorectal cancer screening. She is here alone today. She reports that over 2 year she has been having the sensation that there is something in her lower neck anteriorly. This is just above the sternal notch. This is more noticeable after eating but without definite food trigger. She notices it more when she is still her in a quiet place. It seems to be better if she doesn't eat. She has a long-standing and chronic throat clearing that she feels is necessary to clear phlegm from her throat. She does not have heartburn, dysphagia or odynophagia. She took pantoprazole 40 mg daily for 6 months and this did not help the throat clearing or globus sensation. She's also tried stopping milk and all other medications which did not help either. She reports no abdominal pain. No nausea or vomiting. Good appetite. No early satiety. Normal bowel habits without blood in her stool or melena. No diarrhea or constipation. No family history of colorectal cancer, IBD. She had a colonoscopy 11 years ago in [place] which she reports was normal. She currently denies coughing and chest pain. In the summer of 2015 she developed a rash which she felt possibly was secondary to environmental allergy. This was associated with cough and eventually treated with steroids. The cough resolved entirely. She also has a history of gallstones and is status post cholecystectomy.  Past Medical History  Diagnosis Date  . Hypertension   . Mitral valve prolapse   . Uterine fibroids affecting pregnancy   . Hepatic cyst   . Gallstones   . Basal cell carcinoma   . Kidney stones     Past Surgical History  Procedure Laterality Date  . Cholecystectomy  1998  . Mitral valve  prolaps    . Mohs surgery      basal   . Squamous cell carcinoma excision      Outpatient Prescriptions Prior to Visit  Medication Sig Dispense Refill  . ACTIVELLA 1-0.5 MG per tablet Take 1 tablet by mouth 3 (three) times a week. Once daily    . Coenzyme Q10 (COQ-10 PO) Take 1 tablet by mouth daily. With Vitamins A & E    . OVER THE COUNTER MEDICATION Take 2 capsules by mouth daily. Ultimate Oil (fish oil supplement) 1280 mg    . triamterene-hydrochlorothiazide (MAXZIDE) 75-50 MG per tablet Take 0.5 tablets by mouth daily. Once daily    . Vitamin D, Cholecalciferol, 400 UNITS CHEW Chew 2 tablets by mouth daily.     . ondansetron (ZOFRAN ODT) 8 MG disintegrating tablet Take 1 tablet (8 mg total) by mouth every 8 (eight) hours as needed for nausea or vomiting. 20 tablet 0  . oxyCODONE-acetaminophen (PERCOCET/ROXICET) 5-325 MG per tablet Take 2 tablets by mouth every 4 (four) hours as needed for severe pain. 20 tablet 0  . pantoprazole (PROTONIX) 40 MG tablet Take 1 tablet (40 mg total) by mouth daily. 30 tablet 0  . sulfamethoxazole-trimethoprim (BACTRIM DS) 800-160 MG per tablet Take 1 tablet by mouth 2 (two) times daily. 14 tablet 0  . tamsulosin (FLOMAX) 0.4 MG CAPS capsule Take 1 capsule (0.4 mg total) by mouth daily. 10 capsule 0   No facility-administered medications prior to visit.  Allergies  Allergen Reactions  . Codeine Other (See Comments)    Stomach pain   . Iodine Itching  . Mushroom Ext Cmplx(Shiitake-Reishi-Mait) Nausea Only  . Tape Rash    Adhesive Tape    Family History  Problem Relation Age of Onset  . Heart attack Mother   . Heart attack Maternal Uncle     x 2  . Stomach cancer Maternal Grandmother   . Allergies Mother     food sensitivities  . Diabetes Maternal Aunt     great aunt  . Diabetes Paternal Aunt     History  Substance Use Topics  . Smoking status: Never Smoker   . Smokeless tobacco: Never Used  . Alcohol Use: 0.0 oz/week    0 Standard  drinks or equivalent per week     Comment: 2 drinks per week    ROS: As per history of present illness, otherwise negative  BP 150/90 mmHg  Pulse 96  Ht 5' 3.25" (1.607 m)  Wt 141 lb 4 oz (64.071 kg)  BMI 24.81 kg/m2 Constitutional: Well-developed and well-nourished. No distress. HEENT: Normocephalic and atraumatic. Oropharynx is clear and moist. No oropharyngeal exudate. Conjunctivae are normal.  No scleral icterus. Neck: Neck supple. Trachea midline. Cardiovascular: Normal rate, regular rhythm and intact distal pulses. No M/R/G Pulmonary/chest: Effort normal and breath sounds normal. No wheezing, rales or rhonchi. Abdominal: Soft, nontender, nondistended. Bowel sounds active throughout. There are no masses palpable. No hepatosplenomegaly. Extremities: no clubbing, cyanosis, or edema Lymphadenopathy: No cervical adenopathy noted. Neurological: Alert and oriented to person place and time. Skin: Skin is warm and dry. No rashes noted. Psychiatric: Normal mood and affect. Behavior is normal.  RELEVANT LABS AND IMAGING: CBC    Component Value Date/Time   WBC 9.6 01/24/2014 0003   RBC 4.28 01/24/2014 0003   HGB 13.5 01/24/2014 0003   HCT 39.2 01/24/2014 0003   PLT 234 01/24/2014 0003   MCV 91.6 01/24/2014 0003   MCH 31.5 01/24/2014 0003   MCHC 34.4 01/24/2014 0003   RDW 12.6 01/24/2014 0003   LYMPHSABS 1.1 01/24/2014 0003   MONOABS 0.5 01/24/2014 0003   EOSABS 0.0 01/24/2014 0003   BASOSABS 0.0 01/24/2014 0003    CMP     Component Value Date/Time   NA 138 01/24/2014 0003   K 3.5* 01/24/2014 0003   CL 101 01/24/2014 0003   CO2 25 01/24/2014 0003   GLUCOSE 124* 01/24/2014 0003   BUN 17 01/24/2014 0003   CREATININE 1.02 01/24/2014 0003   CALCIUM 9.2 01/24/2014 0003   PROT 6.8 01/24/2014 0003   ALBUMIN 3.8 01/24/2014 0003   AST 25 01/24/2014 0003   ALT 34 01/24/2014 0003   ALKPHOS 42 01/24/2014 0003   BILITOT 0.3 01/24/2014 0003   GFRNONAA 58* 01/24/2014 0003    GFRAA 43* 01/24/2014 0003    ASSESSMENT/PLAN: 64 yo female with PMH of kidney stones, hypertension, mitral valve prolapse who seen in consultation at the request of Dr. Joylene Draft to evaluate globus sensation and for colorectal cancer screening.  1. Globus sensation -- persistent globus sensation over 2 years. No typical heartburn symptoms. No response to adequate trial of PPI with pantoprazole 40 mg 6 months. I have recommended upper endoscopy to evaluate this symptom. We discussed the risks and benefits and she is agreeable to proceed. If upper endoscopy is unrevealing then my recommendation would be for ENT referral, consideration of high resolution esophageal manometry and possibly cross-sectional imaging of the neck. Check celiac panel.  2. Colorectal cancer screening -- greater than 10 years since last exam. I recommended colonoscopy. Risks and benefits discussed and she is agreeable to proceed.    PJ:KDTOIZT Truxton, Md [address] [place], [place] [address]  CC: Crist Infante, MD

## 2014-11-20 LAB — TISSUE TRANSGLUTAMINASE, IGA: TISSUE TRANSGLUTAMINASE AB, IGA: 1 U/mL (ref <4)

## 2014-11-25 ENCOUNTER — Telehealth: Payer: Self-pay | Admitting: Internal Medicine

## 2014-11-25 NOTE — Telephone Encounter (Signed)
I have given patient access numbers again. She verbalizes understanding.

## 2014-12-23 ENCOUNTER — Telehealth: Payer: Self-pay | Admitting: Internal Medicine

## 2014-12-23 NOTE — Telephone Encounter (Signed)
Returned patient's call and she stated that she was having problems finding Dulcolax 5 mg.  She stated that she found 100 mg.  I placed her on hold to ask Lenard Galloway, RN about this and when I returned to the phone, patient stated that she had found it.  All questions were answered.

## 2014-12-24 ENCOUNTER — Ambulatory Visit (AMBULATORY_SURGERY_CENTER): Payer: BC Managed Care – PPO | Admitting: Internal Medicine

## 2014-12-24 ENCOUNTER — Encounter: Payer: Self-pay | Admitting: Internal Medicine

## 2014-12-24 VITALS — BP 156/80 | HR 75 | Temp 98.4°F | Resp 13 | Ht 63.0 in | Wt 141.0 lb

## 2014-12-24 DIAGNOSIS — D125 Benign neoplasm of sigmoid colon: Secondary | ICD-10-CM | POA: Diagnosis not present

## 2014-12-24 DIAGNOSIS — R0989 Other specified symptoms and signs involving the circulatory and respiratory systems: Secondary | ICD-10-CM

## 2014-12-24 DIAGNOSIS — F458 Other somatoform disorders: Secondary | ICD-10-CM

## 2014-12-24 DIAGNOSIS — Z1211 Encounter for screening for malignant neoplasm of colon: Secondary | ICD-10-CM | POA: Diagnosis present

## 2014-12-24 HISTORY — PX: COLONOSCOPY: SHX174

## 2014-12-24 MED ORDER — SODIUM CHLORIDE 0.9 % IV SOLN: 500.0000 mL | INTRAVENOUS | Status: DC

## 2014-12-24 NOTE — Progress Notes (Signed)
Called to room to assist during endoscopic procedure.  Patient ID and intended procedure confirmed with present staff. Received instructions for my participation in the procedure from the performing physician.  

## 2014-12-24 NOTE — Op Note (Signed)
[place]  [address]. [place], [id number]   ENDOSCOPY PROCEDURE REPORT  PATIENT: Melissa, Cole  MR#: [id number] BIRTHDATE: 10/02/1950 , 63  yrs. old GENDER: female ENDOSCOPIST: Jerene Bears, MD REFERRED BY:  Crist Infante, M.D. PROCEDURE DATE:  12/24/2014 PROCEDURE:  EGD, diagnostic ASA CLASS:     Class II INDICATIONS:  globus sensation MEDICATIONS: Monitored anesthesia care and Propofol 200 mg IV TOPICAL ANESTHETIC: none  DESCRIPTION OF PROCEDURE: After the risks benefits and alternatives of the procedure were thoroughly explained, informed consent was obtained.  The LB [id number] endoscope was introduced through the mouth and advanced to the second portion of the duodenum , Without limitations.  The instrument was slowly withdrawn as the mucosa was fully examined.      EXAM: The esophagus and gastroesophageal junction were completely normal in appearance.  The stomach was entered and closely examined.The antrum, angularis, and lesser curvature were well visualized, including a retroflexed view of the cardia and fundus. The stomach wall was normally distensible.  The scope passed easily through the pylorus into the duodenum.  Retroflexed views revealed no abnormalities.     The scope was then withdrawn from the patient and the procedure completed.  COMPLICATIONS: There were no immediate complications.  ENDOSCOPIC IMPRESSION: Normal EGD without source for globus sensation  RECOMMENDATIONS: If globus sensation continues to be problematic then next step in evaluation would include ENT referral and esophageal manometry  eSigned:  Jerene Bears, MD 12/24/2014 2:19 PM    YY:QMGN Perini, MD and The Patient

## 2014-12-24 NOTE — Op Note (Signed)
[place]  [address]. [place], [id number]   COLONOSCOPY PROCEDURE REPORT  PATIENT: Melissa Cole, Melissa Cole  MR#: [id number] BIRTHDATE: Aug 26, 1951 , 63  yrs. old GENDER: female ENDOSCOPIST: Jerene Bears, MD PROCEDURE DATE:  12/24/2014 PROCEDURE:   Colonoscopy with snare polypectomy First Screening Colonoscopy - Avg.  risk and is 50 yrs.  old or older - No.  Prior Negative Screening - Now for repeat screening. 10 or more years since last screening  History of Adenoma - Now for follow-up colonoscopy & has been > or = to 3 yrs.  N/A ASA CLASS:   Class II INDICATIONS:Screening for colonic neoplasia and Colorectal Neoplasm Risk Assessment for this procedure is average risk. MEDICATIONS: Monitored anesthesia care, Residual sedation present, and Propofol 120 mg IV  DESCRIPTION OF PROCEDURE:   After the risks benefits and alternatives of the procedure were thoroughly explained, informed consent was obtained.  The digital rectal exam revealed no rectal mass.   The LB [id number]  endoscope was introduced through the anus and advanced to the cecum, which was identified by both the appendix and ileocecal valve. No adverse events experienced. The quality of the prep was (MoviPrep was used) good.  The instrument was then slowly withdrawn as the colon was fully examined.   COLON FINDINGS: A sessile polyp measuring 4 mm in size was found in the sigmoid colon.  A polypectomy was performed with a cold snare. The resection was complete but the polyp tissue was not retrieved. The examination was otherwise normal.  Retroflexed views revealed internal hemorrhoids. The time to cecum = 3.2 Withdrawal time = 8.9 The scope was withdrawn and the procedure completed.  COMPLICATIONS: There were no immediate complications.  ENDOSCOPIC IMPRESSION: 1.   Sessile polyp was found in the sigmoid colon; polypectomy was performed with a cold snare 2.   The examination was otherwise  normal  RECOMMENDATIONS: Repeat Colonoscopy in 5 years.  eSigned:  Jerene Bears, MD 12/24/2014 2:22 PM   cc: Crist Infante, MD and The Patient

## 2014-12-24 NOTE — Progress Notes (Signed)
Report to Doyle Askew, RN, VSS

## 2014-12-24 NOTE — Patient Instructions (Signed)

## 2014-12-25 ENCOUNTER — Telehealth: Payer: Self-pay

## 2014-12-25 NOTE — Telephone Encounter (Signed)
Left message on answering machine. 

## 2015-08-25 ENCOUNTER — Other Ambulatory Visit: Payer: Self-pay

## 2015-08-25 DIAGNOSIS — Z1231 Encounter for screening mammogram for malignant neoplasm of breast: Secondary | ICD-10-CM

## 2015-09-26 ENCOUNTER — Ambulatory Visit
Admission: RE | Admit: 2015-09-26 | Discharge: 2015-09-26 | Disposition: A | Discharge status: Home / Self Care | Payer: BC Managed Care – PPO | Source: Ambulatory Visit

## 2015-09-26 DIAGNOSIS — Z1231 Encounter for screening mammogram for malignant neoplasm of breast: Secondary | ICD-10-CM

## 2016-10-08 ENCOUNTER — Other Ambulatory Visit: Payer: Self-pay | Admitting: Internal Medicine

## 2016-10-08 DIAGNOSIS — Z1231 Encounter for screening mammogram for malignant neoplasm of breast: Secondary | ICD-10-CM

## 2016-11-05 ENCOUNTER — Ambulatory Visit
Admission: RE | Admit: 2016-11-05 | Discharge: 2016-11-05 | Disposition: A | Discharge status: Home / Self Care | Payer: Medicare Other | Source: Ambulatory Visit | Attending: Internal Medicine | Admitting: Internal Medicine

## 2016-11-05 DIAGNOSIS — Z1231 Encounter for screening mammogram for malignant neoplasm of breast: Secondary | ICD-10-CM

## 2016-11-10 ENCOUNTER — Other Ambulatory Visit: Payer: Self-pay | Admitting: Internal Medicine

## 2016-11-10 DIAGNOSIS — N183 Chronic kidney disease, stage 3 unspecified: Secondary | ICD-10-CM

## 2016-11-19 ENCOUNTER — Ambulatory Visit
Admission: RE | Admit: 2016-11-19 | Discharge: 2016-11-19 | Disposition: A | Discharge status: Home / Self Care | Payer: Medicare Other | Source: Ambulatory Visit | Attending: Internal Medicine | Admitting: Internal Medicine

## 2016-11-19 DIAGNOSIS — N183 Chronic kidney disease, stage 3 unspecified: Secondary | ICD-10-CM

## 2016-11-24 ENCOUNTER — Other Ambulatory Visit: Payer: Self-pay | Admitting: Internal Medicine

## 2016-11-24 DIAGNOSIS — L729 Follicular cyst of the skin and subcutaneous tissue, unspecified: Secondary | ICD-10-CM

## 2016-11-26 ENCOUNTER — Ambulatory Visit
Admission: RE | Admit: 2016-11-26 | Discharge: 2016-11-26 | Disposition: A | Discharge status: Home / Self Care | Payer: Medicare Other | Source: Ambulatory Visit | Attending: Internal Medicine | Admitting: Internal Medicine

## 2016-11-26 DIAGNOSIS — L729 Follicular cyst of the skin and subcutaneous tissue, unspecified: Secondary | ICD-10-CM

## 2017-10-24 ENCOUNTER — Other Ambulatory Visit: Payer: Self-pay | Admitting: Internal Medicine

## 2017-10-24 DIAGNOSIS — Z1231 Encounter for screening mammogram for malignant neoplasm of breast: Secondary | ICD-10-CM

## 2017-11-11 ENCOUNTER — Ambulatory Visit
Admission: RE | Admit: 2017-11-11 | Discharge: 2017-11-11 | Disposition: A | Discharge status: Home / Self Care | Payer: Medicare Other | Source: Ambulatory Visit | Attending: Internal Medicine | Admitting: Internal Medicine

## 2017-11-11 DIAGNOSIS — Z1231 Encounter for screening mammogram for malignant neoplasm of breast: Secondary | ICD-10-CM

## 2018-01-02 ENCOUNTER — Other Ambulatory Visit: Payer: Self-pay | Admitting: Internal Medicine

## 2018-01-02 DIAGNOSIS — N83292 Other ovarian cyst, left side: Secondary | ICD-10-CM

## 2018-01-09 ENCOUNTER — Ambulatory Visit
Admission: RE | Admit: 2018-01-09 | Discharge: 2018-01-09 | Disposition: A | Discharge status: Home / Self Care | Payer: Medicare Other | Source: Ambulatory Visit | Attending: Internal Medicine | Admitting: Internal Medicine

## 2018-01-09 DIAGNOSIS — N83292 Other ovarian cyst, left side: Secondary | ICD-10-CM

## 2018-02-24 ENCOUNTER — Other Ambulatory Visit: Payer: Medicare Other

## 2018-02-24 ENCOUNTER — Ambulatory Visit: Payer: Medicare Other | Admitting: Physician Assistant

## 2018-02-24 ENCOUNTER — Encounter: Payer: Self-pay | Admitting: Physician Assistant

## 2018-02-24 ENCOUNTER — Encounter

## 2018-02-24 VITALS — BP 122/82 | HR 72 | Ht 64.0 in | Wt 164.1 lb

## 2018-02-24 DIAGNOSIS — K529 Noninfective gastroenteritis and colitis, unspecified: Secondary | ICD-10-CM | POA: Diagnosis not present

## 2018-02-24 MED ORDER — CHOLESTYRAMINE 4 G PO PACK: 4.0000 g | PACK | Freq: Two times a day (BID) | ORAL | 2 refills | Status: DC

## 2018-02-24 NOTE — Patient Instructions (Addendum)
If you are age 67 or older, your body mass index should be between 23-30. Your Body mass index is 28.17 kg/m. If this is out of the aforementioned range listed, please consider follow up with your Primary Care Provider.  If you are age 70 or younger, your body mass index should be between 19-25. Your Body mass index is 28.17 kg/m. If this is out of the aformentioned range listed, please consider follow up with your Primary Care Provider.   Your provider has requested that you go to the basement level for lab work before leaving today. Press "B" on the elevator. The lab is located at the first door on the left as you exit the elevator.  We have sent the following medications to your pharmacy for you to pick up at your convenience: Cholestyramine  Follow up with me on March 10, 2018 at 3:15 pm.  Thank you for choosing me and [facility].  Ellouise Newer, PA-C

## 2018-02-24 NOTE — Progress Notes (Addendum)
Chief Complaint: Chronic Diarrhea  HPI:    Melissa Cole is a 67 year old female known to Dr. Hilarie Fredrickson, with a past medical history as listed below who was referred to me by Crist Infante, MD for a complaint of diarrhea.      Office visit 11/19/2014 for evaluation of globus sensation and colorectal cancer screening.    12/24/2014 colonoscopy Dr. Hilarie Fredrickson with sessile polyp in the sigmoid colon otherwise normal.  Repeat recommended in 5 years.  EGD without source for globus sensation.  ENT referral recommended as next step.    Today, explains she has had diarrhea for many years now.  Initially thought this was related to her previous cholecystectomy many years ago, but now beginning to have questions.  First thing in the morning she will typically have a more solid stool after eating or drinking anything but then this is followed by 4 "explosive loose stools".  The same pattern recurs after lunch but after dinnertime the patient has no further bowel movements until the next morning.  Denies any accompanying abdominal pain.  Does describe someone she knows was diagnosed with what sounds like SIBO and wonders if it could be this.    Social history positive for going to [place] with her daughter at the end of July and would like to have a solution for this prior to then.    Denies fever, chills, blood in her stool, weight loss, anorexia, nausea, vomiting, heartburn, reflux or symptoms that awaken her at night.  Past Medical History:  Diagnosis Date  . Basal cell carcinoma   . Gallstones   . Hepatic cyst   . Hypertension   . Kidney stones   . Mitral valve prolapse   . Uterine fibroids affecting pregnancy     Past Surgical History:  Procedure Laterality Date  . CHOLECYSTECTOMY  1998  . MITRAL VALVE PROLAPS    . MOHS SURGERY     basal   . SQUAMOUS CELL CARCINOMA EXCISION      Current Outpatient Medications  Medication Sig Dispense Refill  . ACTIVELLA 1-0.5 MG per tablet Take 1 tablet by mouth 3  (three) times a week. Once daily    . Coenzyme Q10 (COQ-10 PO) Take 1 tablet by mouth daily. With Vitamins A & E    . EVENING PRIMROSE OIL PO Take 1,300 mg by mouth daily.    Marland Kitchen OVER THE COUNTER MEDICATION Take 2 capsules by mouth daily. Ultimate Oil (fish oil supplement) 1280 mg    . triamterene-hydrochlorothiazide (MAXZIDE) 75-50 MG per tablet Take 0.5 tablets by mouth daily. Once daily    . Vitamin D, Cholecalciferol, 400 UNITS CHEW Chew 2 tablets by mouth daily.      No current facility-administered medications for this visit.     Allergies as of 02/24/2018 - Review Complete 12/24/2014  Allergen Reaction Noted  . Codeine Other (See Comments) 03/26/2011  . Iodine Itching 03/26/2011  . Mushroom ext cmplx(shiitake-reishi-mait) Nausea Only 10/29/2011  . Tape Rash 11/19/2014    Family History  Problem Relation Age of Onset  . Heart attack Mother   . Allergies Mother        food sensitivities  . Heart attack Maternal Uncle        x 2  . Stomach cancer Maternal Grandmother   . Diabetes Maternal Aunt        great aunt  . Diabetes Paternal Aunt     Social History   Socioeconomic History  . Marital status: Widowed  Spouse name: Not on file  . Number of children: 2  . Years of education: Not on file  . Highest education level: Not on file  Occupational History  . Occupation: Market researcher retired  Scientific laboratory technician  . Financial resource strain: Not on file  . Food insecurity:    Worry: Not on file    Inability: Not on file  . Transportation needs:    Medical: Not on file    Non-medical: Not on file  Tobacco Use  . Smoking status: Never Smoker  . Smokeless tobacco: Never Used  Substance and Sexual Activity  . Alcohol use: Yes    Alcohol/week: 0.0 oz    Comment: 2 drinks per week  . Drug use: No  . Sexual activity: Not on file  Lifestyle  . Physical activity:    Days per week: Not on file    Minutes per session: Not on file  . Stress: Not on file  Relationships    . Social connections:    Talks on phone: Not on file    Gets together: Not on file    Attends religious service: Not on file    Active member of club or organization: Not on file    Attends meetings of clubs or organizations: Not on file    Relationship status: Not on file  . Intimate partner violence:    Fear of current or ex partner: Not on file    Emotionally abused: Not on file    Physically abused: Not on file    Forced sexual activity: Not on file  Other Topics Concern  . Not on file  Social History Narrative  . Not on file    Review of Systems:    Constitutional: No weight loss, fever or chills Skin: No rash Cardiovascular: No chest pain Respiratory: No SOB  Gastrointestinal: See HPI and otherwise negative Genitourinary: No dysuria Neurological: No headache, dizziness or syncope Musculoskeletal: No new muscle or joint pain Hematologic: No bleeding  Psychiatric: No history of depression or anxiety   Physical Exam:  Vital signs: BP 122/82   Pulse 72   Ht 5\' 4"  (1.626 m)   Wt 164 lb 1.6 oz (74.4 kg)   BMI 28.17 kg/m    Constitutional:   Pleasant Caucasian female appears to be in NAD, Well developed, Well nourished, alert and cooperative Respiratory: Respirations even and unlabored. Lungs clear to auscultation bilaterally.   No wheezes, crackles, or rhonchi.  Cardiovascular: Normal S1, S2. No MRG. Regular rate and rhythm. No peripheral edema, cyanosis or pallor.  Gastrointestinal:  Soft, nondistended, nontender. No rebound or guarding. Normal bowel sounds. No appreciable masses or hepatomegaly. Rectal:  Not performed.  Msk:  Symmetrical without gross deformities. Without edema, no deformity or joint abnormality.  Neurologic:  Alert and  oriented x4;  grossly normal neurologically.  Skin:   Dry and intact without significant lesions or rashes. Psychiatric: Demonstrates good judgement and reason without abnormal affect or behaviors.  No recent  labs.  Assessment: 1.  Chronic diarrhea: for years, multiple loose bowel movements throughout the day, previous cholecystectomy, colonoscopy in 2016, though no biopsies were taken; consider bile dumping versus IBS versus SIBO versus less likely infectious cause  Plan: 1.  Ordered stool studies to include a GI pathogen panel, O&P and fecal pancreatic elastase 2.  Prescribed Cholestyramine, 4 g twice daily # 60 packets with 2 refills 3.  Patient will follow in clinic with me prior to leaving for [place], if Cholestyramine  is not helping and stool studies are negative we will supply her with Lomotil to have during her trip.  We will also discuss IBS measures versus testing for SIBO.  Ellouise Newer, PA-C [facility] 02/24/2018, 10:22 AM  Cc: Crist Infante, MD   Addendum: Reviewed and agree with initial management. Pyrtle, Lajuan Lines, MD

## 2018-03-03 ENCOUNTER — Other Ambulatory Visit: Payer: Medicare Other

## 2018-03-03 DIAGNOSIS — K529 Noninfective gastroenteritis and colitis, unspecified: Secondary | ICD-10-CM

## 2018-03-08 ENCOUNTER — Telehealth: Payer: Self-pay | Admitting: Physician Assistant

## 2018-03-08 NOTE — Telephone Encounter (Signed)
I am so happy she is feeling better!  We can reschedule her appt for some time after she comes back from her trip. Thanks-JLL

## 2018-03-08 NOTE — Telephone Encounter (Signed)
Spoke with patient and her pharmacy had to order the [place]. She has been on it for 1 week and was much better immediately. She is scheduled for OV on 03/10/18 and wants to know if she should keep this appointment or reschedule in July since she has only been on the medication for a week. (she is leaving the country on July 25th) Please, advise.  Also, she asked about the stool results. Gave her negative GI panel and negative O&P. Explained that the pancreatic elastase is still pending.

## 2018-03-08 NOTE — Telephone Encounter (Signed)
Patient aware. Appointment cancelled. She will call and reschedule after her trip.

## 2018-03-09 LAB — GASTROINTESTINAL PATHOGEN PANEL PCR
C. difficile Tox A/B, PCR: NOT DETECTED
CAMPYLOBACTER, PCR: NOT DETECTED
Cryptosporidium, PCR: NOT DETECTED
E COLI 0157, PCR: NOT DETECTED
E coli (ETEC) LT/ST PCR: NOT DETECTED
E coli (STEC) stx1/stx2, PCR: NOT DETECTED
Giardia lamblia, PCR: NOT DETECTED
Norovirus, PCR: NOT DETECTED
Rotavirus A, PCR: NOT DETECTED
SALMONELLA, PCR: NOT DETECTED
SHIGELLA, PCR: NOT DETECTED

## 2018-03-09 LAB — PANCREATIC ELASTASE, FECAL: Pancreatic Elastase-1, Stool: >500 mcg/g

## 2018-03-09 LAB — OVA AND PARASITE EXAMINATION
CONCENTRATE RESULT: NONE SEEN
SPECIMEN QUALITY:: ADEQUATE
TRICHROME RESULT: NONE SEEN
VKL: 90715786

## 2018-03-10 ENCOUNTER — Ambulatory Visit: Payer: Medicare Other | Admitting: Physician Assistant

## 2018-03-20 ENCOUNTER — Encounter: Payer: Self-pay | Admitting: Physician Assistant

## 2018-03-20 NOTE — Telephone Encounter (Signed)
I am glad to hear the cholestyramine is working, she can have refills As far as her transdermal medication and tramadol this needs to be refilled by her primary care provider, Dr. Joylene Draft

## 2018-03-21 ENCOUNTER — Encounter: Payer: Self-pay | Admitting: Physician Assistant

## 2018-03-24 MED ORDER — CHOLESTYRAMINE 4 G PO PACK: 4.0000 g | PACK | Freq: Two times a day (BID) | ORAL | 2 refills | Status: DC

## 2018-04-10 ENCOUNTER — Ambulatory Visit: Payer: Medicare Other | Admitting: Internal Medicine

## 2018-06-20 ENCOUNTER — Other Ambulatory Visit (HOSPITAL_BASED_OUTPATIENT_CLINIC_OR_DEPARTMENT_OTHER): Payer: Self-pay | Admitting: Nephrology

## 2018-06-20 DIAGNOSIS — D751 Secondary polycythemia: Secondary | ICD-10-CM

## 2018-06-20 DIAGNOSIS — N182 Chronic kidney disease, stage 2 (mild): Secondary | ICD-10-CM

## 2018-06-23 ENCOUNTER — Ambulatory Visit (HOSPITAL_BASED_OUTPATIENT_CLINIC_OR_DEPARTMENT_OTHER)
Admission: RE | Admit: 2018-06-23 | Discharge: 2018-06-23 | Disposition: A | Discharge status: Home / Self Care | Payer: Medicare Other | Source: Ambulatory Visit | Attending: Nephrology | Admitting: Nephrology

## 2018-06-23 DIAGNOSIS — D751 Secondary polycythemia: Secondary | ICD-10-CM | POA: Diagnosis present

## 2018-06-23 DIAGNOSIS — N182 Chronic kidney disease, stage 2 (mild): Secondary | ICD-10-CM | POA: Diagnosis not present

## 2018-10-30 ENCOUNTER — Other Ambulatory Visit: Payer: Self-pay | Admitting: Internal Medicine

## 2018-10-30 DIAGNOSIS — Z1231 Encounter for screening mammogram for malignant neoplasm of breast: Secondary | ICD-10-CM

## 2018-11-24 ENCOUNTER — Ambulatory Visit
Admission: RE | Admit: 2018-11-24 | Discharge: 2018-11-24 | Disposition: A | Discharge status: Home / Self Care | Payer: Medicare Other | Source: Ambulatory Visit | Attending: Internal Medicine | Admitting: Internal Medicine

## 2018-11-24 DIAGNOSIS — Z1231 Encounter for screening mammogram for malignant neoplasm of breast: Secondary | ICD-10-CM

## 2019-10-26 ENCOUNTER — Other Ambulatory Visit: Payer: Self-pay | Admitting: Internal Medicine

## 2019-10-26 DIAGNOSIS — Z1231 Encounter for screening mammogram for malignant neoplasm of breast: Secondary | ICD-10-CM

## 2019-12-05 ENCOUNTER — Ambulatory Visit: Payer: Medicare Other

## 2019-12-25 ENCOUNTER — Other Ambulatory Visit: Payer: Self-pay

## 2019-12-25 ENCOUNTER — Ambulatory Visit
Admission: RE | Admit: 2019-12-25 | Discharge: 2019-12-25 | Disposition: A | Discharge status: Home / Self Care | Payer: Medicare PPO | Source: Ambulatory Visit | Attending: Internal Medicine | Admitting: Internal Medicine

## 2019-12-25 DIAGNOSIS — Z1231 Encounter for screening mammogram for malignant neoplasm of breast: Secondary | ICD-10-CM

## 2019-12-26 ENCOUNTER — Other Ambulatory Visit: Payer: Self-pay | Admitting: Internal Medicine

## 2019-12-26 DIAGNOSIS — R928 Other abnormal and inconclusive findings on diagnostic imaging of breast: Secondary | ICD-10-CM

## 2020-01-07 ENCOUNTER — Other Ambulatory Visit: Payer: Self-pay | Admitting: Internal Medicine

## 2020-01-07 ENCOUNTER — Other Ambulatory Visit: Payer: Self-pay

## 2020-01-07 ENCOUNTER — Ambulatory Visit
Admission: RE | Admit: 2020-01-07 | Discharge: 2020-01-07 | Disposition: A | Discharge status: Home / Self Care | Payer: Medicare PPO | Source: Ambulatory Visit | Attending: Internal Medicine | Admitting: Internal Medicine

## 2020-01-07 DIAGNOSIS — R928 Other abnormal and inconclusive findings on diagnostic imaging of breast: Secondary | ICD-10-CM

## 2020-01-12 HISTORY — PX: BREAST BIOPSY: SHX20

## 2020-01-15 ENCOUNTER — Ambulatory Visit
Admission: RE | Admit: 2020-01-15 | Discharge: 2020-01-15 | Disposition: A | Discharge status: Home / Self Care | Payer: Medicare PPO | Source: Ambulatory Visit | Attending: Internal Medicine | Admitting: Internal Medicine

## 2020-01-15 ENCOUNTER — Other Ambulatory Visit: Payer: Self-pay

## 2020-01-15 DIAGNOSIS — R928 Other abnormal and inconclusive findings on diagnostic imaging of breast: Secondary | ICD-10-CM

## 2020-01-23 ENCOUNTER — Other Ambulatory Visit: Payer: Self-pay

## 2020-01-23 ENCOUNTER — Ambulatory Visit (AMBULATORY_SURGERY_CENTER): Payer: Self-pay | Admitting: *Deleted

## 2020-01-23 VITALS — Temp 96.6°F | Ht 64.0 in | Wt 160.0 lb

## 2020-01-23 DIAGNOSIS — Z86010 Personal history of colonic polyps: Secondary | ICD-10-CM

## 2020-01-23 MED ORDER — SUTAB 1479-225-188 MG PO TABS: 1.0000 | ORAL_TABLET | ORAL | 0 refills | Status: DC

## 2020-01-23 NOTE — Progress Notes (Signed)
Patient is here in-person for PV. Patient denies any allergies to eggs or soy. Patient denies any problems with anesthesia/sedation. Patient denies any oxygen use at home. Patient denies taking any diet/weight loss medications or blood thinners. Patient is not being treated for MRSA or C-diff. Patient is aware of our care-partner policy and 0000000 safety protocol. Completed covid vaccines on 10/22/19. Prep Prescription coupon given to the patient.

## 2020-01-29 ENCOUNTER — Encounter: Payer: Self-pay | Admitting: Internal Medicine

## 2020-02-06 ENCOUNTER — Other Ambulatory Visit: Payer: Self-pay

## 2020-02-06 ENCOUNTER — Ambulatory Visit (AMBULATORY_SURGERY_CENTER): Payer: Medicare PPO | Admitting: Internal Medicine

## 2020-02-06 ENCOUNTER — Encounter: Payer: Self-pay | Admitting: Internal Medicine

## 2020-02-06 VITALS — BP 143/79 | HR 74 | Temp 97.5°F | Resp 9 | Ht 64.0 in | Wt 160.0 lb

## 2020-02-06 DIAGNOSIS — D122 Benign neoplasm of ascending colon: Secondary | ICD-10-CM

## 2020-02-06 DIAGNOSIS — D120 Benign neoplasm of cecum: Secondary | ICD-10-CM

## 2020-02-06 DIAGNOSIS — D124 Benign neoplasm of descending colon: Secondary | ICD-10-CM

## 2020-02-06 DIAGNOSIS — Z86010 Personal history of colonic polyps: Secondary | ICD-10-CM

## 2020-02-06 MED ORDER — SODIUM CHLORIDE 0.9 % IV SOLN: 500.0000 mL | Freq: Once | INTRAVENOUS | Status: DC

## 2020-02-06 NOTE — Progress Notes (Signed)
Temp-JB VS-CW  Pt's states no medical or surgical changes since previsit or office visit.  

## 2020-02-06 NOTE — Progress Notes (Signed)
Called to room to assist during endoscopic procedure.  Patient ID and intended procedure confirmed with present staff. Received instructions for my participation in the procedure from the performing physician.  

## 2020-02-06 NOTE — Op Note (Signed)
[facility] Patient Name: Melissa Cole Procedure Date: 02/06/2020 8:44 AM MRN: [id number] Endoscopist: Jerene Bears , MD Age: 69 Referring MD:  Date of Birth: [birth year] Gender: Female Account #: [id number] Procedure:                Colonoscopy Indications:              High risk colon cancer surveillance: Personal                            history of colonic polyps, Last colonoscopy 5 years                            ago Medicines:                Monitored Anesthesia Care Procedure:                Pre-Anesthesia Assessment:                           - Prior to the procedure, a History and Physical                            was performed, and patient medications and                            allergies were reviewed. The patient's tolerance of                            previous anesthesia was also reviewed. The risks                            and benefits of the procedure and the sedation                            options and risks were discussed with the patient.                            All questions were answered, and informed consent                            was obtained. Prior Anticoagulants: The patient has                            taken no previous anticoagulant or antiplatelet                            agents. ASA Grade Assessment: II - A patient with                            mild systemic disease. After reviewing the risks                            and benefits, the patient was deemed in  satisfactory condition to undergo the procedure.                           After obtaining informed consent, the colonoscope                            was passed under direct vision. Throughout the                            procedure, the patient's blood pressure, pulse, and                            oxygen saturations were monitored continuously. The                            Colonoscope was introduced through the anus and                   advanced to the cecum, identified by appendiceal                            orifice and ileocecal valve. The colonoscopy was                            performed without difficulty. The patient tolerated                            the procedure well. The quality of the bowel                            preparation was good. The ileocecal valve,                            appendiceal orifice, and rectum were photographed. Scope In: 8:55:14 AM Scope Out: 9:12:54 AM Scope Withdrawal Time: 0 hours 15 minutes 10 seconds  Total Procedure Duration: 0 hours 17 minutes 40 seconds  Findings:                 The digital rectal exam was normal.                           A 15 mm polyp was found in the cecum. The polyp was                            sessile. The polyp was removed with a piecemeal                            technique using a cold snare. Resection and                            retrieval were complete.                           A 5 mm polyp was found in the ascending colon. The  polyp was sessile. The polyp was removed with a                            cold snare. Resection and retrieval were complete.                           A 5 mm polyp was found in the descending colon. The                            polyp was sessile. The polyp was removed with a                            cold snare. Resection and retrieval were complete.                           A few small-mouthed diverticula were found in the                            sigmoid colon.                           The retroflexed view of the distal rectum and anal                            verge was normal and showed no anal or rectal                            abnormalities. Complications:            No immediate complications. Estimated Blood Loss:     Estimated blood loss was minimal. Impression:               - One 15 mm polyp in the cecum, removed piecemeal                             using a cold snare. Resected and retrieved.                           - One 5 mm polyp in the ascending colon, removed                            with a cold snare. Resected and retrieved.                           - One 5 mm polyp in the descending colon, removed                            with a cold snare. Resected and retrieved.                           - Diverticulosis in the sigmoid colon.                           - The distal  rectum and anal verge are normal on                            retroflexion view. Recommendation:           - Patient has a contact number available for                            emergencies. The signs and symptoms of potential                            delayed complications were discussed with the                            patient. Return to normal activities tomorrow.                            Written discharge instructions were provided to the                            patient.                           - Resume previous diet.                           - Continue present medications.                           - Await pathology results.                           - Repeat colonoscopy is recommended for                            surveillance likely in 3 years. The colonoscopy                            date will be determined after pathology results                            from today's exam become available for review. Jerene Bears, MD 02/06/2020 9:15:40 AM This report has been signed electronically.

## 2020-02-06 NOTE — Patient Instructions (Signed)
YOU HAD AN ENDOSCOPIC PROCEDURE TODAY AT THE  ENDOSCOPY CENTER:   Refer to the procedure report that was given to you for any specific questions about what was found during the examination.  If the procedure report does not answer your questions, please call your gastroenterologist to clarify.  If you requested that your care partner not be given the details of your procedure findings, then the procedure report has been included in a sealed envelope for you to review at your convenience later.  **Handouts given on polyps and Diverticulosis**  YOU SHOULD EXPECT: Some feelings of bloating in the abdomen. Passage of more gas than usual.  Walking can help get rid of the air that was put into your GI tract during the procedure and reduce the bloating. If you had a lower endoscopy (such as a colonoscopy or flexible sigmoidoscopy) you may notice spotting of blood in your stool or on the toilet paper. If you underwent a bowel prep for your procedure, you may not have a normal bowel movement for a few days.  Please Note:  You might notice some irritation and congestion in your nose or some drainage.  This is from the oxygen used during your procedure.  There is no need for concern and it should clear up in a day or so.  SYMPTOMS TO REPORT IMMEDIATELY:  Following lower endoscopy (colonoscopy or flexible sigmoidoscopy):  Excessive amounts of blood in the stool  Significant tenderness or worsening of abdominal pains  Swelling of the abdomen that is new, acute  Fever of 100F or higher  For urgent or emergent issues, a gastroenterologist can be reached at any hour by calling [phone]. Do not use MyChart messaging for urgent concerns.    DIET:  We do recommend a small meal at first, but then you may proceed to your regular diet.  Drink plenty of fluids but you should avoid alcoholic beverages for 24 hours.  ACTIVITY:  You should plan to take it easy for the rest of today and you should NOT DRIVE  or use heavy machinery until tomorrow (because of the sedation medicines used during the test).    FOLLOW UP: Our staff will call the number listed on your records 48-72 hours following your procedure to check on you and address any questions or concerns that you may have regarding the information given to you following your procedure. If we do not reach you, we will leave a message.  We will attempt to reach you two times.  During this call, we will ask if you have developed any symptoms of COVID 19. If you develop any symptoms (ie: fever, flu-like symptoms, shortness of breath, cough etc.) before then, please call [phone].  If you test positive for Covid 19 in the 2 weeks post procedure, please call and report this information to us.    If any biopsies were taken you will be contacted by phone or by letter within the next 1-3 weeks.  Please call us at [phone] if you have not heard about the biopsies in 3 weeks.    SIGNATURES/CONFIDENTIALITY: You and/or your care partner have signed paperwork which will be entered into your electronic medical record.  These signatures attest to the fact that that the information above on your After Visit Summary has been reviewed and is understood.  Full responsibility of the confidentiality of this discharge information lies with you and/or your care-partner.  

## 2020-02-06 NOTE — Progress Notes (Signed)
To PACU, VSS. Report to [email or web address] 

## 2020-02-08 ENCOUNTER — Telehealth: Payer: Self-pay

## 2020-02-08 ENCOUNTER — Encounter: Payer: Self-pay | Admitting: Internal Medicine

## 2020-02-08 ENCOUNTER — Telehealth: Payer: Self-pay | Admitting: *Deleted

## 2020-02-08 NOTE — Telephone Encounter (Signed)
Message left

## 2020-02-08 NOTE — Telephone Encounter (Signed)
LVM

## 2020-07-23 DIAGNOSIS — D0471 Carcinoma in situ of skin of right lower limb, including hip: Secondary | ICD-10-CM | POA: Diagnosis not present

## 2020-07-23 DIAGNOSIS — D485 Neoplasm of uncertain behavior of skin: Secondary | ICD-10-CM | POA: Diagnosis not present

## 2020-07-23 DIAGNOSIS — D2239 Melanocytic nevi of other parts of face: Secondary | ICD-10-CM | POA: Diagnosis not present

## 2020-09-04 DIAGNOSIS — L578 Other skin changes due to chronic exposure to nonionizing radiation: Secondary | ICD-10-CM | POA: Diagnosis not present

## 2020-09-04 DIAGNOSIS — Z85828 Personal history of other malignant neoplasm of skin: Secondary | ICD-10-CM | POA: Diagnosis not present

## 2020-09-04 DIAGNOSIS — C44311 Basal cell carcinoma of skin of nose: Secondary | ICD-10-CM | POA: Diagnosis not present

## 2020-09-04 DIAGNOSIS — L814 Other melanin hyperpigmentation: Secondary | ICD-10-CM | POA: Diagnosis not present

## 2020-12-09 ENCOUNTER — Other Ambulatory Visit: Payer: Self-pay | Admitting: Internal Medicine

## 2020-12-09 DIAGNOSIS — Z1231 Encounter for screening mammogram for malignant neoplasm of breast: Secondary | ICD-10-CM

## 2020-12-24 DIAGNOSIS — L821 Other seborrheic keratosis: Secondary | ICD-10-CM | POA: Diagnosis not present

## 2020-12-24 DIAGNOSIS — L570 Actinic keratosis: Secondary | ICD-10-CM | POA: Diagnosis not present

## 2020-12-24 DIAGNOSIS — D485 Neoplasm of uncertain behavior of skin: Secondary | ICD-10-CM | POA: Diagnosis not present

## 2020-12-24 DIAGNOSIS — D1801 Hemangioma of skin and subcutaneous tissue: Secondary | ICD-10-CM | POA: Diagnosis not present

## 2020-12-24 DIAGNOSIS — L814 Other melanin hyperpigmentation: Secondary | ICD-10-CM | POA: Diagnosis not present

## 2020-12-24 DIAGNOSIS — Z85828 Personal history of other malignant neoplasm of skin: Secondary | ICD-10-CM | POA: Diagnosis not present

## 2021-02-02 ENCOUNTER — Other Ambulatory Visit: Payer: Self-pay

## 2021-02-02 ENCOUNTER — Ambulatory Visit
Admission: RE | Admit: 2021-02-02 | Discharge: 2021-02-02 | Disposition: A | Discharge status: Home / Self Care | Payer: Medicare PPO | Source: Ambulatory Visit | Attending: Internal Medicine | Admitting: Internal Medicine

## 2021-02-02 DIAGNOSIS — Z1231 Encounter for screening mammogram for malignant neoplasm of breast: Secondary | ICD-10-CM

## 2021-02-02 DIAGNOSIS — H2513 Age-related nuclear cataract, bilateral: Secondary | ICD-10-CM | POA: Diagnosis not present

## 2021-02-02 DIAGNOSIS — H524 Presbyopia: Secondary | ICD-10-CM | POA: Diagnosis not present

## 2021-03-17 DIAGNOSIS — R300 Dysuria: Secondary | ICD-10-CM | POA: Diagnosis not present

## 2021-06-17 DIAGNOSIS — E785 Hyperlipidemia, unspecified: Secondary | ICD-10-CM | POA: Diagnosis not present

## 2021-06-17 DIAGNOSIS — E538 Deficiency of other specified B group vitamins: Secondary | ICD-10-CM | POA: Diagnosis not present

## 2021-06-17 DIAGNOSIS — R7301 Impaired fasting glucose: Secondary | ICD-10-CM | POA: Diagnosis not present

## 2021-06-17 DIAGNOSIS — I10 Essential (primary) hypertension: Secondary | ICD-10-CM | POA: Diagnosis not present

## 2021-06-17 DIAGNOSIS — E559 Vitamin D deficiency, unspecified: Secondary | ICD-10-CM | POA: Diagnosis not present

## 2021-06-24 ENCOUNTER — Other Ambulatory Visit: Payer: Self-pay | Admitting: Internal Medicine

## 2021-06-24 DIAGNOSIS — Z1331 Encounter for screening for depression: Secondary | ICD-10-CM | POA: Diagnosis not present

## 2021-06-24 DIAGNOSIS — Z780 Asymptomatic menopausal state: Secondary | ICD-10-CM | POA: Diagnosis not present

## 2021-06-24 DIAGNOSIS — I10 Essential (primary) hypertension: Secondary | ICD-10-CM | POA: Diagnosis not present

## 2021-06-24 DIAGNOSIS — E785 Hyperlipidemia, unspecified: Secondary | ICD-10-CM | POA: Diagnosis not present

## 2021-06-24 DIAGNOSIS — N1831 Chronic kidney disease, stage 3a: Secondary | ICD-10-CM | POA: Diagnosis not present

## 2021-06-24 DIAGNOSIS — Z0000 Encounter for general adult medical examination without abnormal findings: Secondary | ICD-10-CM | POA: Diagnosis not present

## 2021-06-24 DIAGNOSIS — R7301 Impaired fasting glucose: Secondary | ICD-10-CM | POA: Diagnosis not present

## 2021-06-24 DIAGNOSIS — Z1212 Encounter for screening for malignant neoplasm of rectum: Secondary | ICD-10-CM | POA: Diagnosis not present

## 2021-06-24 DIAGNOSIS — R82998 Other abnormal findings in urine: Secondary | ICD-10-CM | POA: Diagnosis not present

## 2021-06-24 DIAGNOSIS — E538 Deficiency of other specified B group vitamins: Secondary | ICD-10-CM | POA: Diagnosis not present

## 2021-06-24 DIAGNOSIS — M2550 Pain in unspecified joint: Secondary | ICD-10-CM | POA: Diagnosis not present

## 2021-06-30 DIAGNOSIS — Z780 Asymptomatic menopausal state: Secondary | ICD-10-CM | POA: Diagnosis not present

## 2021-07-01 DIAGNOSIS — I10 Essential (primary) hypertension: Secondary | ICD-10-CM | POA: Diagnosis not present

## 2021-07-03 DIAGNOSIS — I10 Essential (primary) hypertension: Secondary | ICD-10-CM | POA: Diagnosis not present

## 2021-07-13 ENCOUNTER — Ambulatory Visit
Admission: RE | Admit: 2021-07-13 | Discharge: 2021-07-13 | Disposition: A | Discharge status: Home / Self Care | Payer: Medicare PPO | Source: Ambulatory Visit | Attending: Internal Medicine | Admitting: Internal Medicine

## 2021-07-13 DIAGNOSIS — E785 Hyperlipidemia, unspecified: Secondary | ICD-10-CM

## 2021-08-23 NOTE — Progress Notes (Signed)
Cardiology Office Note:    Date:  08/23/2021   ID:  Melissa Cole, DOB Mar 27, 1951, MRN [id number]  PCP:  Crist Infante, MD   [facility] Providers Cardiologist:  None     Referring MD: Crist Infante, MD   No chief complaint on file. High CAC  History of Present Illness:    Melissa Cole is a 70 y.o. female with a hx below, referral for CAC 471 , non gated study,  She just started crestor 20 mg. She can walk up a flight of stairs. She denies chest pain, orthopnea, or PND. No noted apnea  She has HTN. 24 hour blood pressure monitoring with BP 179/102 at baseline. She's prescribed triamterene/HCTz 75-50 mg. She was told the values were normal  with 24 monitoring.  No history of stroke  Her diet is ok. She eats sugar and likes salty foods. She eats vegetables and fruit. She's retired. She has a puppy; she walks her 5x per day. She does a lot of yard work. She has no limitations.  She had  stress test 18 years ago. She would get SOB with going up the stairs. She was on nuclear exercise stress. That was normal. She has MVP, she has known for 20 years.. We have no echo here. Her mother and 2 uncles had hx of heart dx.   CVD Risk/Equivalent: HLD- yes HTN- yes PAD- No DMII - No, A1c 4.8% Smoker-No Premature Family History- Mother had MI < 56, died 27 of MI. Maternal uncle ~ 46 of MI  06/17/2021 CRT 1.o Normal LFTs LDL 116 HDL 61 HDL 66 TC 190 TG 66 A1c 4.9%  CORONARY CALCIUM SCORES:   Left Main: 0   LAD: 57   LCx: 0   RCA: 414  Total Agatston Score: 471   MESA database percentile: 92   There is some motion artifact at the level of the proximal RCA which likely artificially raises the RCA score slightly.   AORTA MEASUREMENTS:   Ascending Aorta: 34 mm   Descending Aorta: 22 mm   OTHER FINDINGS:   The heart size is within normal limits. No pericardial fluid is identified. Visualized segments of the thoracic aorta and central pulmonary arteries are  normal in caliber. Visualized mediastinum and hilar regions demonstrate no lymphadenopathy or masses. There is subpleural scarring in the posterior right lower lobe with some associated mild bronchiectasis. Visualized lungs show no evidence of pulmonary edema, consolidation, pneumothorax, nodule or pleural fluid. Visualized upper abdomen and bony structures are unremarkable.   IMPRESSION: 1. Coronary calcium score of 471 is at the 92nd percentile for the patient's age, sex and race. 2. Posterior right lower lobe scarring and mild bronchiectasis, likely the residua of prior infection.    Past Medical History:  Diagnosis Date   Allergy    Basal cell carcinoma    Blood transfusion without reported diagnosis 1969   Cataract    Gallstones    Hepatic cyst    Hypertension    Kidney stones    Mitral valve prolapse    Post-operative nausea and vomiting    Stage 2 chronic kidney disease    Uterine fibroids affecting pregnancy     Past Surgical History:  Procedure Laterality Date   BREAST BIOPSY Left 01/12/2020   CHOLECYSTECTOMY  1998   COLONOSCOPY  12/24/2014   MITRAL VALVE PROLAPS     MOHS SURGERY     basal    POLYPECTOMY     SQUAMOUS CELL CARCINOMA EXCISION  Current Medications: No outpatient medications have been marked as taking for the 08/24/21 encounter (Appointment) with Janina Mayo, MD.     Allergies:   Amlodipine, Benazepril, Codeine, Iodine, Mushroom ext cmplx(shiitake-reishi-mait), Other, Shiitake mushroom, Latex, Oxycodone-acetaminophen, and Tape   Social History   Socioeconomic History   Marital status: Widowed    Spouse name: Not on file   Number of children: 2   Years of education: Not on file   Highest education level: Not on file  Occupational History   Occupation: dental assistant/ retired  Tobacco Use   Smoking status: Never   Smokeless tobacco: Never  Vaping Use   Vaping Use: Never used  Substance and Sexual Activity   Alcohol use: Yes     Comment: occ    Drug use: No   Sexual activity: Not on file  Other Topics Concern   Not on file  Social History Narrative   Not on file   Social Determinants of Health   Financial Resource Strain: Not on file  Food Insecurity: Not on file  Transportation Needs: Not on file  Physical Activity: Not on file  Stress: Not on file  Social Connections: Not on file     Family History: The patient's family history includes Allergies in her mother; Diabetes in her maternal aunt and paternal aunt; Heart attack in her maternal uncle and mother; Stomach cancer in her maternal grandmother. There is no history of Colon cancer, Esophageal cancer, Rectal cancer, or Colon polyps.  ROS:   Please see the history of present illness.     All other systems reviewed and are negative.  EKGs/Labs/Other Studies Reviewed:    The following studies were reviewed today:   EKG:  EKG is  ordered today.  The ekg ordered today demonstrates   NSR, septal q waves  Recent Labs: No results found for requested labs within last 8760 hours.  Recent Lipid Panel No results found for: CHOL, TRIG, HDL, CHOLHDL, VLDL, LDLCALC, LDLDIRECT   Risk Assessment/Calculations:     ASCVD 14%      Physical Exam:    VS:  There were no vitals taken for this visit.    Wt Readings from Last 3 Encounters:  02/06/20 160 lb (72.6 kg)  01/23/20 160 lb (72.6 kg)  02/24/18 164 lb 1.6 oz (74.4 kg)     GEN:  Well nourished, well developed in no acute distress HEENT: Normal NECK: No JVD; No carotid bruits LYMPHATICS: No lymphadenopathy CARDIAC: RRR, no murmurs, rubs, gallops RESPIRATORY:  Clear to auscultation without rales, wheezing or rhonchi  ABDOMEN: Soft, non-tender, non-distended MUSCULOSKELETAL:  No edema; No deformity  SKIN: Warm and dry NEUROLOGIC:  Alert and oriented x 3 PSYCHIATRIC:  Normal affect   ASSESSMENT:    #Elevated CAC:  > 300 but < 1000. She is asymptomatic. She is moderate risk with ASCVD  14%. Her LDL goal at least < 100 mg/d to 70 mg/dL. Her  LDL 116 mg/dL.  She started Crestor 20 mg daily with Dr. Joylene Draft with plans to re-evaluate her lipids. Can increase crestor and add zetia if not at goal. If LDL remains not at goal can refer to the lipid clinic with Dr. Debara Pickett.  #MVP: she reports hx. She is asymptomatic. Will obtain echo here for surveillance.   #HTN: Per patient more [place] hypertension. Her home Bps are well controlled form 24 hour BP monitoring with PCP.  triamterene/HCTz 75-50 mg   PLAN:    In order of problems listed  above:  TTE Follow up in 6 months        Medication Adjustments/Labs and Tests Ordered: Current medicines are reviewed at length with the patient today.  Concerns regarding medicines are outlined above.    Signed, Janina Mayo, MD  08/23/2021 8:41 PM    [place]

## 2021-08-24 ENCOUNTER — Encounter: Payer: Self-pay | Admitting: Internal Medicine

## 2021-08-24 ENCOUNTER — Other Ambulatory Visit: Payer: Self-pay

## 2021-08-24 ENCOUNTER — Ambulatory Visit: Payer: Medicare PPO | Admitting: Internal Medicine

## 2021-08-24 VITALS — BP 140/80 | HR 93 | Ht 64.0 in | Wt 155.6 lb

## 2021-08-24 DIAGNOSIS — I341 Nonrheumatic mitral (valve) prolapse: Secondary | ICD-10-CM | POA: Diagnosis not present

## 2021-08-24 NOTE — Patient Instructions (Signed)
Medication Instructions:  ?No Changes In Medications at this time.  ?*If you need a refill on your cardiac medications before your next appointment, please call your pharmacy* ? ?Testing/Procedures: ?Your physician has requested that you have an echocardiogram. Echocardiography is a painless test that uses sound waves to create images of your heart. It provides your doctor with information about the size and shape of your heart and how well your heart?s chambers and valves are working. You may receive an ultrasound enhancing agent through an IV if needed to better visualize your heart during the echo.This procedure takes approximately one hour. There are no restrictions for this procedure. This will take place at the [address], [address].  ? ?Follow-Up: ?At [facility], you and your health needs are our priority.  As part of our continuing mission to provide you with exceptional heart care, we have created designated Provider Care Teams.  These Care Teams include your primary Cardiologist (physician) and Advanced Practice Providers (APPs -  Physician Assistants and Nurse Practitioners) who all work together to provide you with the care you need, when you need it. ? ?Your next appointment:   ?6 month(s) ? ?The format for your next appointment:   ?In Person ? ?Provider:   ?Branch, Mary E, MD   ?

## 2021-08-28 DIAGNOSIS — L814 Other melanin hyperpigmentation: Secondary | ICD-10-CM | POA: Diagnosis not present

## 2021-08-28 DIAGNOSIS — L570 Actinic keratosis: Secondary | ICD-10-CM | POA: Diagnosis not present

## 2021-08-28 DIAGNOSIS — D485 Neoplasm of uncertain behavior of skin: Secondary | ICD-10-CM | POA: Diagnosis not present

## 2021-08-28 DIAGNOSIS — Z85828 Personal history of other malignant neoplasm of skin: Secondary | ICD-10-CM | POA: Diagnosis not present

## 2021-08-28 DIAGNOSIS — C4441 Basal cell carcinoma of skin of scalp and neck: Secondary | ICD-10-CM | POA: Diagnosis not present

## 2021-08-28 DIAGNOSIS — L821 Other seborrheic keratosis: Secondary | ICD-10-CM | POA: Diagnosis not present

## 2021-09-10 ENCOUNTER — Ambulatory Visit (HOSPITAL_COMMUNITY): Payer: Medicare PPO | Attending: Internal Medicine

## 2021-09-10 ENCOUNTER — Other Ambulatory Visit: Payer: Self-pay

## 2021-09-10 DIAGNOSIS — I341 Nonrheumatic mitral (valve) prolapse: Secondary | ICD-10-CM | POA: Diagnosis not present

## 2021-09-10 LAB — ECHOCARDIOGRAM COMPLETE
Area-P 1/2: 4.56 cm2
P 1/2 time: 363 msec
S' Lateral: 1.9 cm

## 2021-09-22 DIAGNOSIS — E785 Hyperlipidemia, unspecified: Secondary | ICD-10-CM | POA: Diagnosis not present

## 2021-09-23 DIAGNOSIS — C44319 Basal cell carcinoma of skin of other parts of face: Secondary | ICD-10-CM | POA: Diagnosis not present

## 2021-10-12 ENCOUNTER — Ambulatory Visit: Payer: Medicare PPO | Admitting: Podiatry

## 2021-10-12 ENCOUNTER — Other Ambulatory Visit: Payer: Self-pay

## 2021-10-12 ENCOUNTER — Ambulatory Visit (INDEPENDENT_AMBULATORY_CARE_PROVIDER_SITE_OTHER): Payer: Medicare PPO

## 2021-10-12 DIAGNOSIS — B351 Tinea unguium: Secondary | ICD-10-CM | POA: Diagnosis not present

## 2021-10-12 DIAGNOSIS — M79671 Pain in right foot: Secondary | ICD-10-CM

## 2021-10-12 DIAGNOSIS — M79672 Pain in left foot: Secondary | ICD-10-CM | POA: Diagnosis not present

## 2021-10-12 DIAGNOSIS — M778 Other enthesopathies, not elsewhere classified: Secondary | ICD-10-CM

## 2021-10-16 NOTE — Progress Notes (Signed)
Subjective:   Patient ID: Melissa Cole, female   DOB: 71 y.o.   MRN: [id number]   HPI 71 year old female presents the office today for concerns of bilateral nail fungus.  She states that started at 8 years ago on the right toenails on the left big toe.  She denies discoloration.  Denies any drainage or pus.  She states that also she needs some pain on the top of her foot.  She states that started 6 years ago and thought it was coming from flip-flops.  Discussed throbbing sensation.  Wearing shoes makes the pain worse.  No recent treatment.  No other concerns today.   Review of Systems  All other systems reviewed and are negative.  Past Medical History:  Diagnosis Date   Allergy    Basal cell carcinoma    Blood transfusion without reported diagnosis 1969   Cataract    Gallstones    Hepatic cyst    Hypertension    Kidney stones    Mitral valve prolapse    Post-operative nausea and vomiting    Stage 2 chronic kidney disease    Uterine fibroids affecting pregnancy     Past Surgical History:  Procedure Laterality Date   BREAST BIOPSY Left 01/12/2020   CHOLECYSTECTOMY  1998   COLONOSCOPY  12/24/2014   MITRAL VALVE PROLAPS     MOHS SURGERY     basal    POLYPECTOMY     SQUAMOUS CELL CARCINOMA EXCISION       Current Outpatient Medications:    ACTIVELLA 1-0.5 MG per tablet, Take 1 tablet by mouth 3 (three) times a week. Once daily, Disp: , Rfl:    Cholecalciferol (VITAMIN D3 PO), Take by mouth., Disp: , Rfl:    co-enzyme Q-10 30 MG capsule, Take 30 mg by mouth 3 (three) times daily., Disp: , Rfl:    Cyanocobalamin (VITAMIN B-12 PO), Take 3,000 mcg by mouth daily., Disp: , Rfl:    fexofenadine (ALLEGRA) 180 MG tablet, Take 180 mg by mouth daily., Disp: , Rfl:    MAGNESIUM-POTASSIUM PO, Take by mouth., Disp: , Rfl:    niacinamide 500 MG tablet, Take 500 mg by mouth., Disp: , Rfl:    OVER THE COUNTER MEDICATION, Take 2 capsules by mouth daily. Ultimate Oil (fish oil supplement)  1280 mg + CoQQ 10, Disp: , Rfl:    rosuvastatin (CRESTOR) 20 MG tablet, Take 20 mg by mouth daily. Not sure about name of medication, Disp: , Rfl:    TraMADol HCl 100 MG CP24, Take by mouth as needed., Disp: , Rfl:    triamterene-hydrochlorothiazide (MAXZIDE) 75-50 MG per tablet, Take 0.5 tablets by mouth daily. Once daily, Disp: , Rfl:   Allergies  Allergen Reactions   Amlodipine Cough    Other reaction(s): OTHER Other reaction(s): OTHER    Benazepril Other (See Comments)    Other reaction(s): RASH Other reaction(s): RASH Other reaction(s): RASH    Codeine Other (See Comments)    Stomach pain    Iodine Itching   Mushroom Ext Cmplx(Shiitake-Reishi-Mait) Nausea Only   Other Nausea Only    Paint,  Cig. smoke   Shiitake Mushroom    Latex Rash   Oxycodone-Acetaminophen Nausea Only   Tape Rash    Adhesive Tape          Objective:  Physical Exam  General: AAO x3, NAD  Dermatological: Bilateral hallux nails are hypertrophic and dystrophic with yellow-brown discoloration.  No edema, erythema or signs of infection.  No  open lesions.  Vascular: Dorsalis Pedis artery and Posterior Tibial artery pedal pulses are 2/4 bilateral with immedate capillary fill time. There is no pain with calf compression, swelling, warmth, erythema.   Neruologic: Grossly intact via light touch bilateral.   Musculoskeletal: Tender palpation on the dorsal aspect of the midfoot bilaterally.  No specific area pinpoint tenderness.  No erythema or warmth.  Flexor, extensor tendons appear to be intact.  Muscular strength 5/5 in all groups tested bilateral.  Gait: Unassisted, Nonantalgic.       Assessment:   71 year old female with onychomycosis; midfoot capsulitis     Plan:  -Treatment options discussed including all alternatives, risks, and complications -Etiology of symptoms were discussed -X-rays were obtained and reviewed with the patient.  Evidence of acute fracture noted today.  Arthritic  changes present to the midfoot on the Lisfranc joint. -Regards to her foot pain we discussed shoe modifications, offloading.  Discussed wearing stiffer soled shoes.  Anti-inflammatories as needed.  Consider steroid injection if needed as well. -For the toenails I sharply debrided the nails without any complications or bleeding I sent this for culture to [facility] labs.     Trula Slade DPM

## 2021-10-19 ENCOUNTER — Telehealth: Payer: Self-pay | Admitting: *Deleted

## 2021-10-19 NOTE — Telephone Encounter (Signed)
Patient is calling for the lab results from 10/12/21. Please advise.

## 2021-10-19 NOTE — Telephone Encounter (Signed)
[facility] (spoke w/ Roxy), they do not have those labs as of yet, notified patient that results not in as of yet.

## 2021-10-20 ENCOUNTER — Encounter: Payer: Self-pay | Admitting: Podiatry

## 2021-10-21 NOTE — Telephone Encounter (Signed)
Called  [facility] again today and they do not have the lab results for this patient.

## 2021-10-23 ENCOUNTER — Encounter: Payer: Self-pay | Admitting: Podiatry

## 2021-10-23 ENCOUNTER — Other Ambulatory Visit: Payer: Self-pay | Admitting: Podiatry

## 2021-10-23 MED ORDER — NYSTATIN-TRIAMCINOLONE 100000-0.1 UNIT/GM-% EX OINT: 1.0000 "application " | TOPICAL_OINTMENT | Freq: Two times a day (BID) | CUTANEOUS | 0 refills | Status: DC

## 2021-10-26 NOTE — Telephone Encounter (Signed)
Please advise 

## 2021-10-26 NOTE — Telephone Encounter (Signed)
Results placed in folder

## 2021-12-21 ENCOUNTER — Other Ambulatory Visit: Payer: Self-pay | Admitting: Internal Medicine

## 2021-12-21 DIAGNOSIS — Z1231 Encounter for screening mammogram for malignant neoplasm of breast: Secondary | ICD-10-CM

## 2022-01-18 DIAGNOSIS — I2510 Atherosclerotic heart disease of native coronary artery without angina pectoris: Secondary | ICD-10-CM | POA: Diagnosis not present

## 2022-01-18 DIAGNOSIS — I10 Essential (primary) hypertension: Secondary | ICD-10-CM | POA: Diagnosis not present

## 2022-02-02 DIAGNOSIS — Z9889 Other specified postprocedural states: Secondary | ICD-10-CM | POA: Diagnosis not present

## 2022-02-02 DIAGNOSIS — H524 Presbyopia: Secondary | ICD-10-CM | POA: Diagnosis not present

## 2022-02-02 DIAGNOSIS — H2513 Age-related nuclear cataract, bilateral: Secondary | ICD-10-CM | POA: Diagnosis not present

## 2022-02-03 ENCOUNTER — Ambulatory Visit
Admission: RE | Admit: 2022-02-03 | Discharge: 2022-02-03 | Disposition: A | Discharge status: Home / Self Care | Payer: Medicare PPO | Source: Ambulatory Visit | Attending: Internal Medicine | Admitting: Internal Medicine

## 2022-02-03 DIAGNOSIS — Z1231 Encounter for screening mammogram for malignant neoplasm of breast: Secondary | ICD-10-CM

## 2022-02-23 DIAGNOSIS — E785 Hyperlipidemia, unspecified: Secondary | ICD-10-CM | POA: Diagnosis not present

## 2022-02-23 DIAGNOSIS — I10 Essential (primary) hypertension: Secondary | ICD-10-CM | POA: Diagnosis not present

## 2022-02-24 DIAGNOSIS — H8111 Benign paroxysmal vertigo, right ear: Secondary | ICD-10-CM | POA: Diagnosis not present

## 2022-02-24 DIAGNOSIS — R2681 Unsteadiness on feet: Secondary | ICD-10-CM | POA: Diagnosis not present

## 2022-03-01 ENCOUNTER — Ambulatory Visit: Payer: Medicare PPO | Admitting: Internal Medicine

## 2022-03-19 DIAGNOSIS — R42 Dizziness and giddiness: Secondary | ICD-10-CM | POA: Diagnosis not present

## 2022-03-19 DIAGNOSIS — R2681 Unsteadiness on feet: Secondary | ICD-10-CM | POA: Diagnosis not present

## 2022-04-05 ENCOUNTER — Encounter: Payer: Self-pay | Admitting: Podiatry

## 2022-04-09 IMAGING — CT CT CARDIAC CORONARY ARTERY CALCIUM SCORE
3 series · 14 of 20 positions shown, 16 images · non-contrast
Comparison: None.

CLINICAL DATA: 70-year-old Caucasian female with history of
hyperlipidemia and family history of heart disease.

EXAM:
CT CARDIAC CORONARY ARTERY CALCIUM SCORE
TECHNIQUE: Non-contrast imaging through the heart was performed using
prospective ECG gating. Image post processing was performed on an
independent workstation, allowing for quantitative analysis of the
heart and coronary arteries. Note that this exam targets the heart
and the chest was not imaged in its entirety.

[Series 2: calcium scoring 2.00 qr36 bestdiast 70% hrt calciu · axial · 0.39mm/px · z∈[+1541,+1637]mm · 4 of 80 slices shown]
[im 16/80  vessel]
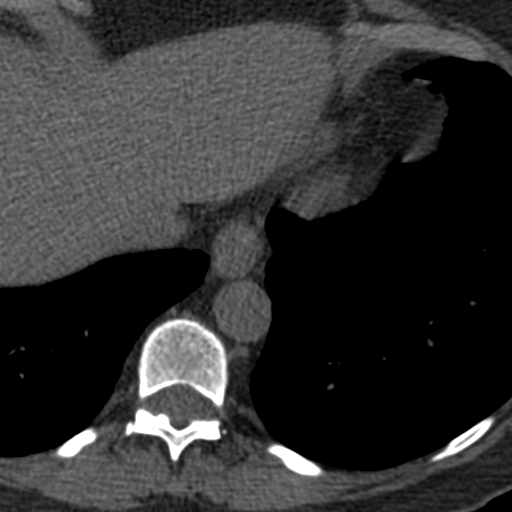
[im 32/80  vessel]
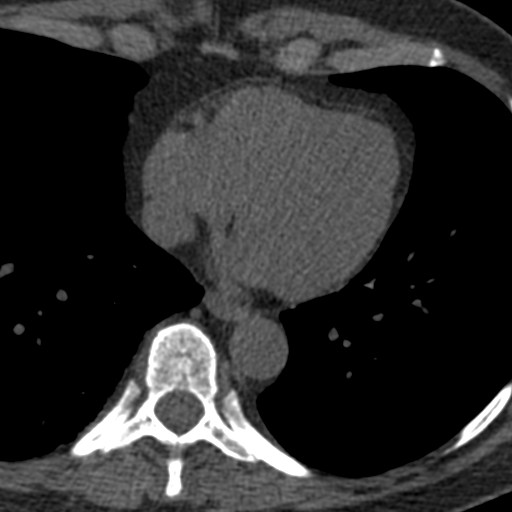
[im 48/80  vessel]
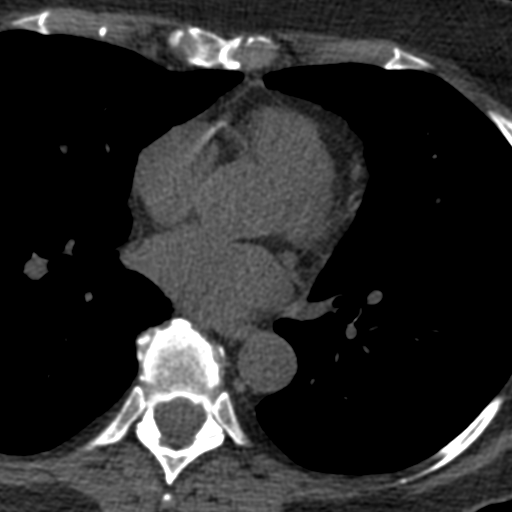
[im 64/80  vessel]
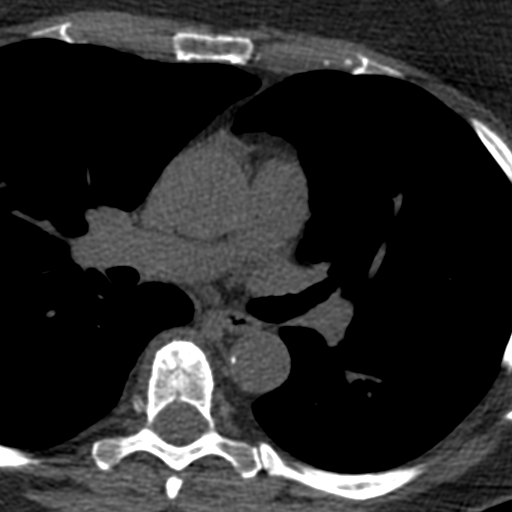

[Series 3: calcium scoring 2.00 br40 bestdiast 70% axial · axial · 0.58mm/px · z∈[+1537,+1641]mm · 5 of 80 slices shown, 7 images]
[im 14/80  vessel]
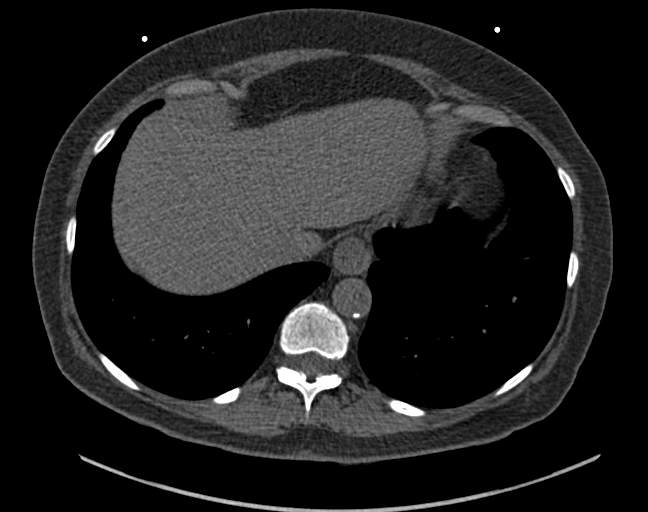
[im 14/80  lung]
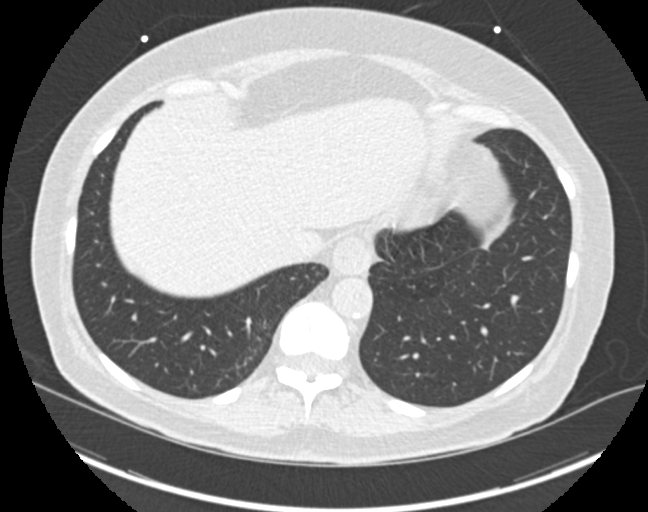
[im 27/80  vessel]
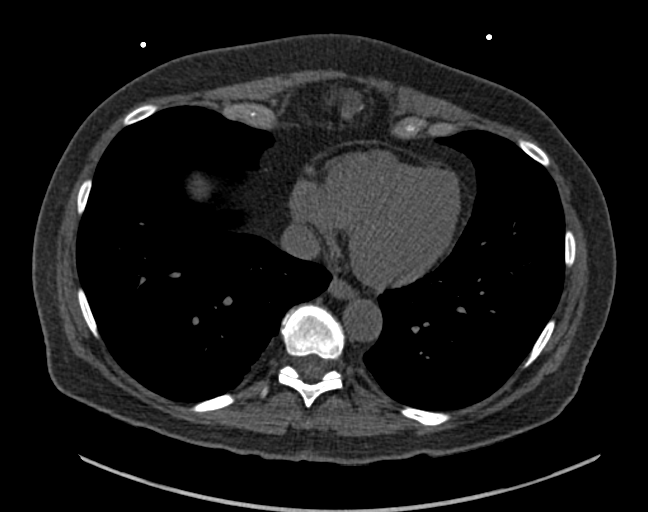
[im 40/80  vessel]
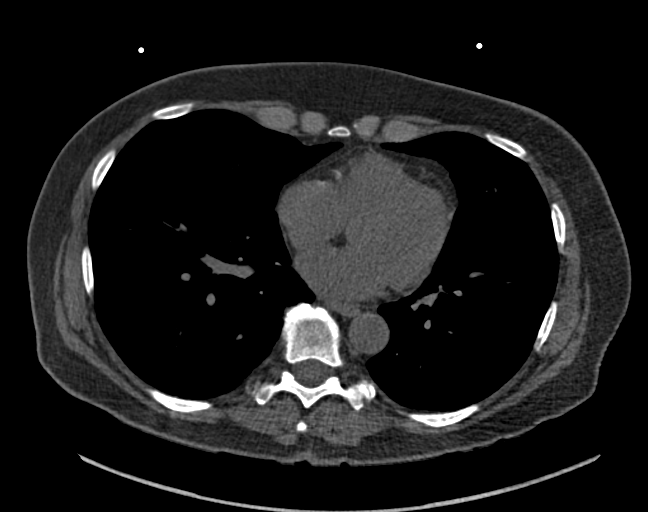
[im 53/80  vessel]
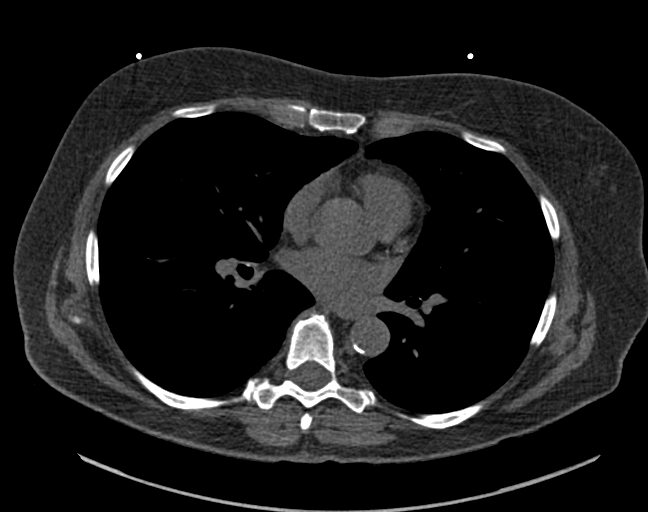
[im 66/80  vessel]
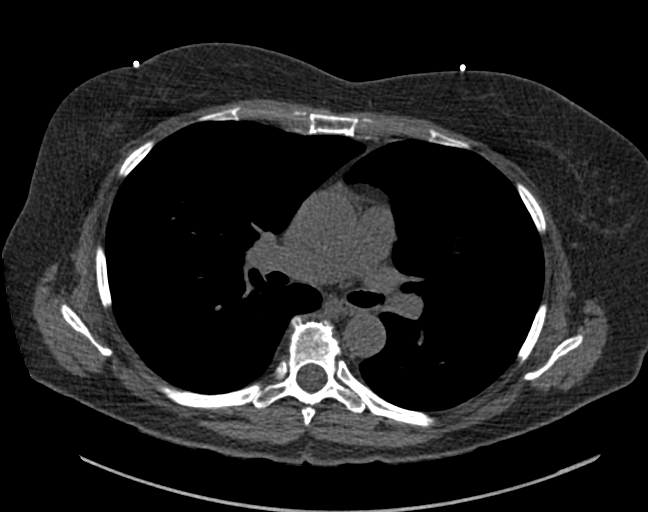
[im 66/80  lung]
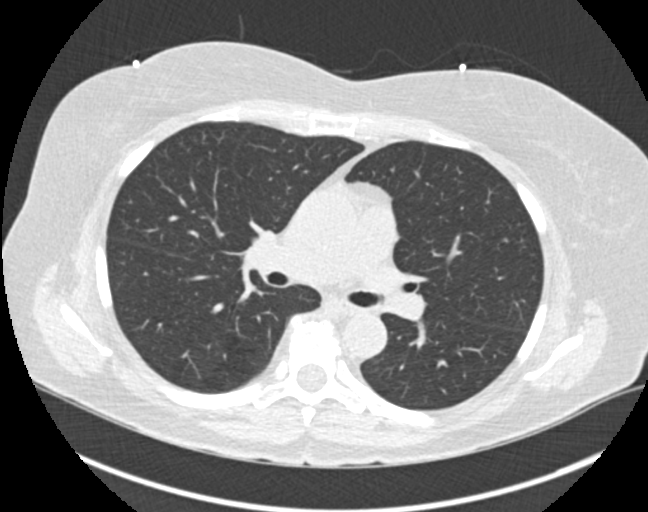

[Series 9: calcium scoring 2.00 br60 bestdiast 70% lungs · axial · 0.58mm/px · z∈[+1537,+1641]mm · 5 of 80 slices shown]
[im 14/80  vessel]
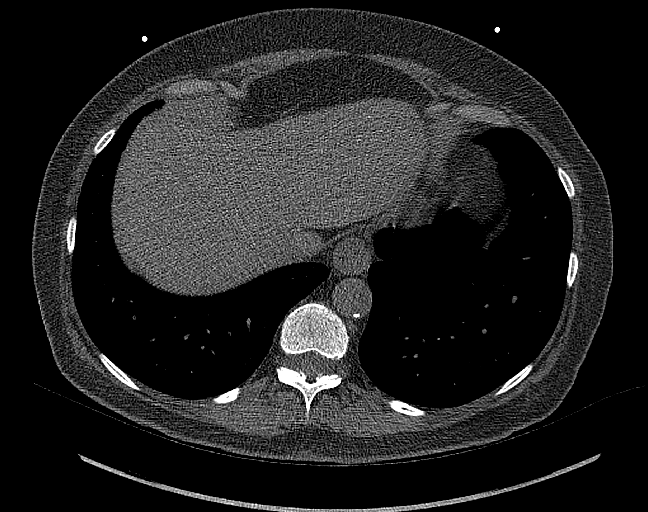
[im 27/80  vessel]
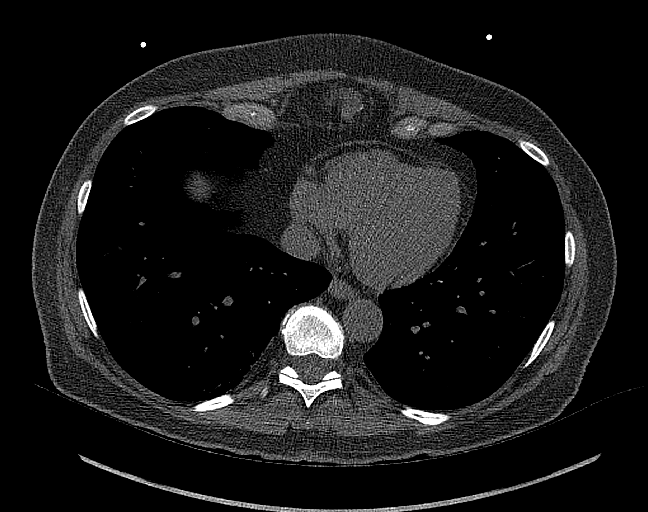
[im 40/80  vessel]
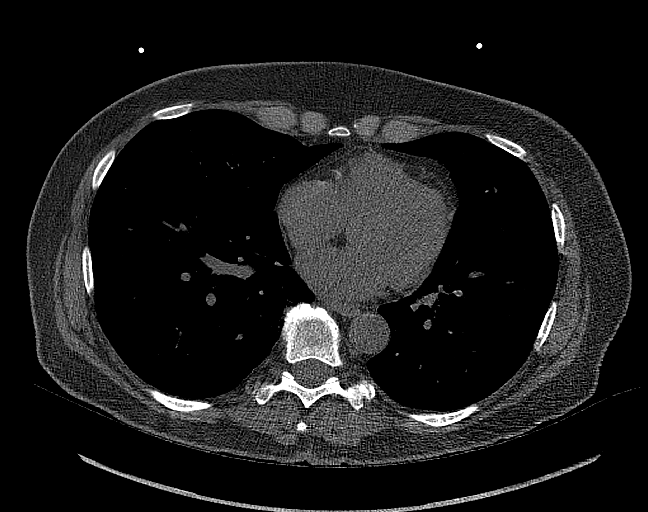
[im 53/80  vessel]
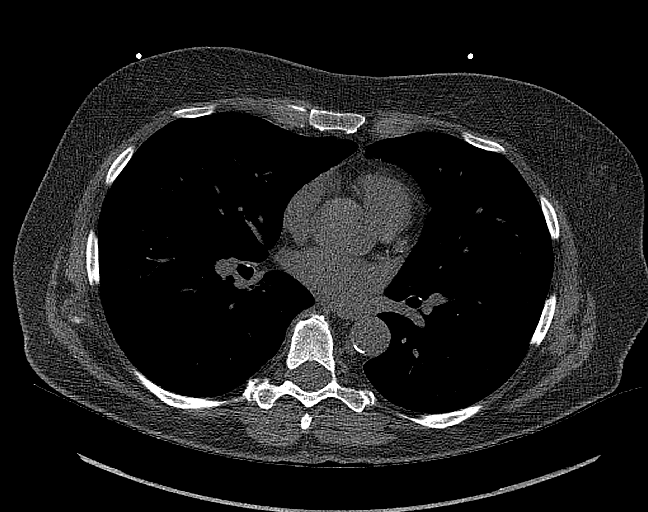
[im 66/80  vessel]
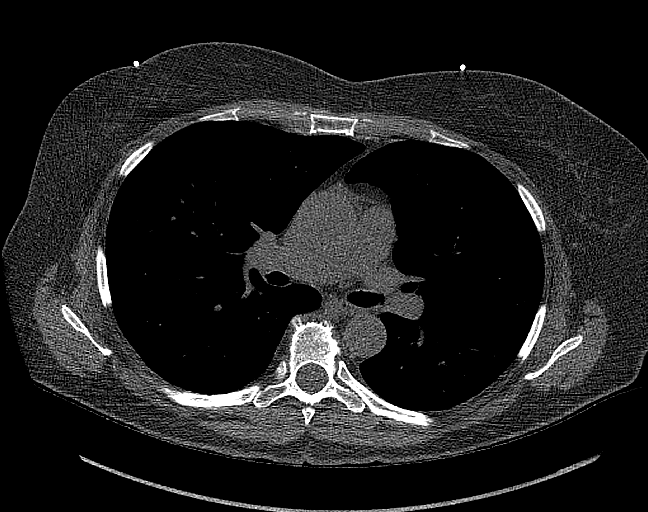

[14 of 20 positions shown; findings below may reference images not displayed]

FINDINGS: CORONARY CALCIUM SCORES:

Left Main: 0

LAD: 57

LCx: 0

RCA: 414

Total Agatston Score: 471

[HOSPITAL] percentile: 92

There is some motion artifact at the level of the proximal RCA which
likely artificially raises the RCA score slightly.

AORTA MEASUREMENTS:

Ascending Aorta: 34 mm

Descending Aorta: 22 mm

OTHER FINDINGS:

The heart size is within normal limits. No pericardial fluid is
identified. Visualized segments of the thoracic aorta and central
pulmonary arteries are normal in caliber. Visualized mediastinum and
hilar regions demonstrate no lymphadenopathy or masses. There is
subpleural scarring in the posterior right lower lobe with some
associated mild bronchiectasis. Visualized lungs show no evidence of
pulmonary edema, consolidation, pneumothorax, nodule or pleural
fluid. Visualized upper abdomen and bony structures are
unremarkable.
IMPRESSION: 1. Coronary calcium score of 471 is at the 92nd percentile for the
patient's age, sex and race.
2. Posterior right lower lobe scarring and mild bronchiectasis,
likely the residua of prior infection.

## 2022-05-21 DIAGNOSIS — L570 Actinic keratosis: Secondary | ICD-10-CM | POA: Diagnosis not present

## 2022-05-21 DIAGNOSIS — Z85828 Personal history of other malignant neoplasm of skin: Secondary | ICD-10-CM | POA: Diagnosis not present

## 2022-05-21 DIAGNOSIS — L601 Onycholysis: Secondary | ICD-10-CM | POA: Diagnosis not present

## 2022-05-21 DIAGNOSIS — D0462 Carcinoma in situ of skin of left upper limb, including shoulder: Secondary | ICD-10-CM | POA: Diagnosis not present

## 2022-05-21 DIAGNOSIS — L821 Other seborrheic keratosis: Secondary | ICD-10-CM | POA: Diagnosis not present

## 2022-05-21 DIAGNOSIS — Z08 Encounter for follow-up examination after completed treatment for malignant neoplasm: Secondary | ICD-10-CM | POA: Diagnosis not present

## 2022-05-21 DIAGNOSIS — L814 Other melanin hyperpigmentation: Secondary | ICD-10-CM | POA: Diagnosis not present

## 2022-05-21 DIAGNOSIS — D225 Melanocytic nevi of trunk: Secondary | ICD-10-CM | POA: Diagnosis not present

## 2022-06-10 DIAGNOSIS — L244 Irritant contact dermatitis due to drugs in contact with skin: Secondary | ICD-10-CM | POA: Diagnosis not present

## 2022-06-10 DIAGNOSIS — D0462 Carcinoma in situ of skin of left upper limb, including shoulder: Secondary | ICD-10-CM | POA: Diagnosis not present

## 2022-06-16 DIAGNOSIS — T753XXA Motion sickness, initial encounter: Secondary | ICD-10-CM | POA: Diagnosis not present

## 2022-06-16 DIAGNOSIS — H8111 Benign paroxysmal vertigo, right ear: Secondary | ICD-10-CM | POA: Diagnosis not present

## 2022-07-01 DIAGNOSIS — E559 Vitamin D deficiency, unspecified: Secondary | ICD-10-CM | POA: Diagnosis not present

## 2022-07-01 DIAGNOSIS — I10 Essential (primary) hypertension: Secondary | ICD-10-CM | POA: Diagnosis not present

## 2022-07-01 DIAGNOSIS — R7301 Impaired fasting glucose: Secondary | ICD-10-CM | POA: Diagnosis not present

## 2022-07-01 DIAGNOSIS — E785 Hyperlipidemia, unspecified: Secondary | ICD-10-CM | POA: Diagnosis not present

## 2022-07-01 DIAGNOSIS — E538 Deficiency of other specified B group vitamins: Secondary | ICD-10-CM | POA: Diagnosis not present

## 2022-07-01 DIAGNOSIS — Z1212 Encounter for screening for malignant neoplasm of rectum: Secondary | ICD-10-CM | POA: Diagnosis not present

## 2022-07-01 DIAGNOSIS — R7989 Other specified abnormal findings of blood chemistry: Secondary | ICD-10-CM | POA: Diagnosis not present

## 2022-07-08 DIAGNOSIS — Z1339 Encounter for screening examination for other mental health and behavioral disorders: Secondary | ICD-10-CM | POA: Diagnosis not present

## 2022-07-08 DIAGNOSIS — I10 Essential (primary) hypertension: Secondary | ICD-10-CM | POA: Diagnosis not present

## 2022-07-08 DIAGNOSIS — E785 Hyperlipidemia, unspecified: Secondary | ICD-10-CM | POA: Diagnosis not present

## 2022-07-08 DIAGNOSIS — N1831 Chronic kidney disease, stage 3a: Secondary | ICD-10-CM | POA: Diagnosis not present

## 2022-07-08 DIAGNOSIS — R82998 Other abnormal findings in urine: Secondary | ICD-10-CM | POA: Diagnosis not present

## 2022-07-08 DIAGNOSIS — I2510 Atherosclerotic heart disease of native coronary artery without angina pectoris: Secondary | ICD-10-CM | POA: Diagnosis not present

## 2022-07-08 DIAGNOSIS — Z1331 Encounter for screening for depression: Secondary | ICD-10-CM | POA: Diagnosis not present

## 2022-07-08 DIAGNOSIS — G43909 Migraine, unspecified, not intractable, without status migrainosus: Secondary | ICD-10-CM | POA: Diagnosis not present

## 2022-07-08 DIAGNOSIS — J479 Bronchiectasis, uncomplicated: Secondary | ICD-10-CM | POA: Diagnosis not present

## 2022-07-08 DIAGNOSIS — N83292 Other ovarian cyst, left side: Secondary | ICD-10-CM | POA: Diagnosis not present

## 2022-07-08 DIAGNOSIS — Z0000 Encounter for general adult medical examination without abnormal findings: Secondary | ICD-10-CM | POA: Diagnosis not present

## 2022-08-03 DIAGNOSIS — R82998 Other abnormal findings in urine: Secondary | ICD-10-CM | POA: Diagnosis not present

## 2022-08-25 DIAGNOSIS — R82998 Other abnormal findings in urine: Secondary | ICD-10-CM | POA: Diagnosis not present

## 2023-01-03 ENCOUNTER — Other Ambulatory Visit: Payer: Self-pay | Admitting: Internal Medicine

## 2023-01-03 DIAGNOSIS — Z1231 Encounter for screening mammogram for malignant neoplasm of breast: Secondary | ICD-10-CM

## 2023-01-17 DIAGNOSIS — R399 Unspecified symptoms and signs involving the genitourinary system: Secondary | ICD-10-CM | POA: Diagnosis not present

## 2023-01-27 ENCOUNTER — Other Ambulatory Visit (HOSPITAL_BASED_OUTPATIENT_CLINIC_OR_DEPARTMENT_OTHER): Payer: Self-pay

## 2023-01-27 ENCOUNTER — Encounter (HOSPITAL_BASED_OUTPATIENT_CLINIC_OR_DEPARTMENT_OTHER): Payer: Self-pay | Admitting: Emergency Medicine

## 2023-01-27 ENCOUNTER — Emergency Department (HOSPITAL_BASED_OUTPATIENT_CLINIC_OR_DEPARTMENT_OTHER)
Admission: EM | Admit: 2023-01-27 | Discharge: 2023-01-27 | Disposition: A | Discharge status: Home / Self Care | Payer: Medicare PPO | Attending: Emergency Medicine | Admitting: Emergency Medicine

## 2023-01-27 ENCOUNTER — Emergency Department (HOSPITAL_BASED_OUTPATIENT_CLINIC_OR_DEPARTMENT_OTHER): Payer: Medicare PPO

## 2023-01-27 ENCOUNTER — Other Ambulatory Visit: Payer: Self-pay

## 2023-01-27 DIAGNOSIS — E876 Hypokalemia: Secondary | ICD-10-CM | POA: Insufficient documentation

## 2023-01-27 DIAGNOSIS — N201 Calculus of ureter: Secondary | ICD-10-CM

## 2023-01-27 DIAGNOSIS — Z91040 Latex allergy status: Secondary | ICD-10-CM | POA: Diagnosis not present

## 2023-01-27 DIAGNOSIS — R109 Unspecified abdominal pain: Secondary | ICD-10-CM | POA: Diagnosis present

## 2023-01-27 DIAGNOSIS — N132 Hydronephrosis with renal and ureteral calculous obstruction: Secondary | ICD-10-CM | POA: Diagnosis not present

## 2023-01-27 LAB — COMPREHENSIVE METABOLIC PANEL
ALT: 21 U/L (ref 0–44)
AST: 32 U/L (ref 15–41)
Albumin: 4.1 g/dL (ref 3.5–5.0)
Alkaline Phosphatase: 45 U/L (ref 38–126)
Anion gap: 12 (ref 5–15)
BUN: 16 mg/dL (ref 8–23)
CO2: 20 mmol/L — ABNORMAL LOW (ref 22–32)
Calcium: 9.3 mg/dL (ref 8.9–10.3)
Chloride: 101 mmol/L (ref 98–111)
Creatinine, Ser: 0.99 mg/dL (ref 0.44–1.00)
GFR, Estimated: >60 mL/min (ref >60)
Glucose, Bld: 109 mg/dL — ABNORMAL HIGH (ref 70–99)
Potassium: 2.9 mmol/L — ABNORMAL LOW (ref 3.5–5.1)
Sodium: 133 mmol/L — ABNORMAL LOW (ref 135–145)
Total Bilirubin: 1 mg/dL (ref 0.3–1.2)
Total Protein: 7.3 g/dL (ref 6.5–8.1)

## 2023-01-27 LAB — CBC WITH DIFFERENTIAL/PLATELET
Abs Immature Granulocytes: 0.05 10*3/uL (ref 0.00–0.07)
Basophils Absolute: 0.1 10*3/uL (ref 0.0–0.1)
Basophils Relative: 1 %
Eosinophils Absolute: 0 10*3/uL (ref 0.0–0.5)
Eosinophils Relative: 0 %
HCT: 41.7 % (ref 36.0–46.0)
Hemoglobin: 14.8 g/dL (ref 12.0–15.0)
Immature Granulocytes: 1 %
Lymphocytes Relative: 13 %
Lymphs Abs: 1.4 10*3/uL (ref 0.7–4.0)
MCH: 30.3 pg (ref 26.0–34.0)
MCHC: 35.5 g/dL (ref 30.0–36.0)
MCV: 85.5 fL (ref 80.0–100.0)
Monocytes Absolute: 0.6 10*3/uL (ref 0.1–1.0)
Monocytes Relative: 6 %
Neutro Abs: 8.5 10*3/uL — ABNORMAL HIGH (ref 1.7–7.7)
Neutrophils Relative %: 79 %
Platelets: 220 10*3/uL (ref 150–400)
RBC: 4.88 MIL/uL (ref 3.87–5.11)
RDW: 13.3 % (ref 11.5–15.5)
WBC: 10.7 10*3/uL — ABNORMAL HIGH (ref 4.0–10.5)
nRBC: 0 % (ref 0.0–0.2)

## 2023-01-27 LAB — URINALYSIS, ROUTINE W REFLEX MICROSCOPIC
Bilirubin Urine: NEGATIVE
Glucose, UA: NEGATIVE mg/dL
Hgb urine dipstick: NEGATIVE
Ketones, ur: NEGATIVE mg/dL
Leukocytes,Ua: NEGATIVE
Nitrite: NEGATIVE
Protein, ur: 30 mg/dL — AB
Specific Gravity, Urine: 1.015 (ref 1.005–1.030)
pH: 8.5 — ABNORMAL HIGH (ref 5.0–8.0)

## 2023-01-27 LAB — URINALYSIS, MICROSCOPIC (REFLEX)

## 2023-01-27 LAB — LIPASE, BLOOD: Lipase: 39 U/L (ref 11–51)

## 2023-01-27 MED ORDER — SODIUM CHLORIDE 0.9 % IV BOLUS
1000.0000 mL | Freq: Once | INTRAVENOUS | Status: AC
  Administered 2023-01-27: 1000 mL via INTRAVENOUS

## 2023-01-27 MED ORDER — HYDROCODONE-ACETAMINOPHEN 5-325 MG PO TABS
1.0000 | ORAL_TABLET | ORAL | 0 refills | Status: DC | PRN
  Filled 2023-01-27: qty 10, 2d supply, fill #0

## 2023-01-27 MED ORDER — FENTANYL CITRATE PF 50 MCG/ML IJ SOSY
50.0000 ug | PREFILLED_SYRINGE | Freq: Once | INTRAMUSCULAR | Status: AC
  Administered 2023-01-27: 50 ug via INTRAVENOUS
  Filled 2023-01-27: qty 1

## 2023-01-27 MED ORDER — ONDANSETRON 4 MG PO TBDP
4.0000 mg | ORAL_TABLET | Freq: Three times a day (TID) | ORAL | 0 refills | Status: DC | PRN
  Filled 2023-01-27: qty 10, 4d supply, fill #0

## 2023-01-27 MED ORDER — POTASSIUM CHLORIDE CRYS ER 20 MEQ PO TBCR
40.0000 meq | EXTENDED_RELEASE_TABLET | Freq: Once | ORAL | Status: DC
  Filled 2023-01-27: qty 2

## 2023-01-27 MED ORDER — KETOROLAC TROMETHAMINE 15 MG/ML IJ SOLN
15.0000 mg | Freq: Once | INTRAMUSCULAR | Status: AC
  Administered 2023-01-27: 15 mg via INTRAVENOUS
  Filled 2023-01-27: qty 1

## 2023-01-27 MED ORDER — ONDANSETRON HCL 4 MG/2ML IJ SOLN
4.0000 mg | Freq: Once | INTRAMUSCULAR | Status: AC
  Administered 2023-01-27: 4 mg via INTRAVENOUS
  Filled 2023-01-27: qty 2

## 2023-01-27 NOTE — ED Provider Notes (Signed)
[place] [facility] Provider Note   CSN: [id number] Arrival date & time: 01/27/23  1124     History  Chief Complaint  Patient presents with   Flank Pain    Melissa Cole is a 72 y.o. female.  HPI 72 year old female with remote history of kidney stones presents with concern for kidney stone.  Around 9 AM developed left flank pain and it is going into her left lower abdomen.  No urinary symptoms.  Pain has been fairly severe.  She denies any fevers or vomiting.  Home Medications Prior to Admission medications   Medication Sig Start Date End Date Taking? Authorizing Provider  HYDROcodone-acetaminophen (NORCO) 5-325 MG tablet Take 1 tablet by mouth every 4 (four) hours as needed for severe pain. 01/27/23  Yes Pricilla Loveless, MD  ondansetron (ZOFRAN-ODT) 4 MG disintegrating tablet Take 1 tablet (4 mg total) by mouth every 8 (eight) hours as needed for nausea or vomiting. 01/27/23  Yes Pricilla Loveless, MD  ACTIVELLA 1-0.5 MG per tablet Take 1 tablet by mouth 3 (three) times a week. Once daily 03/18/11   [provider]  Cholecalciferol (VITAMIN D3 PO) Take by mouth.    [provider]  co-enzyme Q-10 30 MG capsule Take 30 mg by mouth 3 (three) times daily.    [provider]  Cyanocobalamin (VITAMIN B-12 PO) Take 3,000 mcg by mouth daily.    [provider]  fexofenadine (ALLEGRA) 180 MG tablet Take 180 mg by mouth daily.    [provider]  MAGNESIUM-POTASSIUM PO Take by mouth.    [provider]  niacinamide 500 MG tablet Take 500 mg by mouth.    [provider]  nystatin-triamcinolone ointment (MYCOLOG) Apply 1 application topically 2 (two) times daily. 10/23/21   Vivi Barrack, DPM  OVER THE COUNTER MEDICATION Take 2 capsules by mouth daily. Ultimate Oil (fish oil supplement) 1280 mg + CoQQ 10    [provider]  rosuvastatin (CRESTOR) 20 MG tablet Take 20 mg by mouth daily.  Not sure about name of medication    [provider]  TraMADol HCl 100 MG CP24 Take by mouth as needed.    [provider]  triamterene-hydrochlorothiazide (MAXZIDE) 75-50 MG per tablet Take 0.5 tablets by mouth daily. Once daily 03/19/11   [provider]      Allergies    Amlodipine, Benazepril, Codeine, Iodine, Mushroom ext cmplx(shiitake-reishi-mait), Other, Shiitake mushroom, Latex, Oxycodone-acetaminophen, and Tape    Review of Systems   Review of Systems  Gastrointestinal:  Positive for abdominal pain.  Genitourinary:  Positive for flank pain. Negative for dysuria and hematuria.    Physical Exam Updated Vital Signs BP 139/64   Pulse 85   Temp 98 F (36.7 C) (Oral)   Resp 17   Ht 5\' 4"  (1.626 m)   Wt 72.6 kg   SpO2 96%   BMI 27.46 kg/m  Physical Exam Vitals and nursing note reviewed.  Constitutional:      Appearance: She is well-developed.  HENT:     Head: Normocephalic and atraumatic.  Cardiovascular:     Rate and Rhythm: Normal rate and regular rhythm.     Heart sounds: Normal heart sounds.  Pulmonary:     Effort: Pulmonary effort is normal.     Breath sounds: Normal breath sounds.  Abdominal:     Palpations: Abdomen is soft.     Tenderness: There is no abdominal tenderness. There is no right  CVA tenderness or left CVA tenderness.  Skin:    General: Skin is warm and dry.  Neurological:     Mental Status: She is alert.     ED Results / Procedures / Treatments   Labs (all labs ordered are listed, but only abnormal results are displayed) Labs Reviewed  URINALYSIS, ROUTINE W REFLEX MICROSCOPIC - Abnormal; Notable for the following components:      Result Value   pH 8.5 (*)    Protein, ur 30 (*)    All other components within normal limits  URINALYSIS, MICROSCOPIC (REFLEX) - Abnormal; Notable for the following components:   Bacteria, UA RARE (*)    All other components within normal limits  COMPREHENSIVE METABOLIC PANEL -  Abnormal; Notable for the following components:   Sodium 133 (*)    Potassium 2.9 (*)    CO2 20 (*)    Glucose, Bld 109 (*)    All other components within normal limits  CBC WITH DIFFERENTIAL/PLATELET - Abnormal; Notable for the following components:   WBC 10.7 (*)    Neutro Abs 8.5 (*)    All other components within normal limits  URINE CULTURE  LIPASE, BLOOD    EKG None  Radiology CT Renal Stone Study  Result Date: 01/27/2023 CLINICAL DATA:  Abdominal/flank pain, stone suspected. Left flank pain since this morning. EXAM: CT ABDOMEN AND PELVIS WITHOUT CONTRAST TECHNIQUE: Multidetector CT imaging of the abdomen and pelvis was performed following the standard protocol without IV contrast. RADIATION DOSE REDUCTION: This exam was performed according to the departmental dose-optimization program which includes automated exposure control, adjustment of the mA and/or kV according to patient size and/or use of iterative reconstruction technique. COMPARISON:  CT abdomen/pelvis 01/24/2014. FINDINGS: Lower chest: No acute abnormality. Hepatobiliary: No focal liver abnormality is seen. Status post cholecystectomy. No biliary dilatation. Pancreas: Unremarkable. No pancreatic ductal dilatation or surrounding inflammatory changes. Spleen: Normal in size without focal abnormality. Adrenals/Urinary Tract: 17 mm obstructing stone at the left ureteral pelvic junction with associated mild left hydronephrosis. Adrenal glands are unremarkable. No renal calculi or hydronephrosis of the right kidney. Bladder is decompressed. Stomach/Bowel: Small hiatal hernia. No dilated loops of small bowel. Normal appendix is visualized on axial image 41 series 2. No bowel wall thickening or surrounding inflammation. Vascular/Lymphatic: Aortic atherosclerosis. No enlarged abdominal or pelvic lymph nodes. Reproductive: Uterus and bilateral adnexa are unremarkable. Other: No abdominal wall hernia or abnormality. No abdominopelvic  ascites. Musculoskeletal: No acute or significant osseous findings. IMPRESSION: 1. 17 mm obstructing stone at the left ureteral pelvic junction with associated mild left hydronephrosis. 2. Small hiatal hernia. 3. Aortic Atherosclerosis (ICD10-I70.0). Electronically Signed   By: Orvan Falconer M.D.   On: 01/27/2023 15:07    Procedures Procedures    Medications Ordered in ED Medications  ketorolac (TORADOL) 15 MG/ML injection 15 mg (has no administration in time range)  potassium chloride SA (KLOR-CON M) CR tablet 40 mEq (has no administration in time range)  fentaNYL (SUBLIMAZE) injection 50 mcg (50 mcg Intravenous Given 01/27/23 1400)  sodium chloride 0.9 % bolus 1,000 mL (1,000 mLs Intravenous New Bag/Given 01/27/23 1356)  ondansetron (ZOFRAN) injection 4 mg (4 mg Intravenous Given 01/27/23 1359)    ED Course/ Medical Decision Making/ A&P                             Medical Decision Making Amount and/or Complexity of Data Reviewed Labs: ordered.    Details:  Hypokalemia 2.9.  Slight leukocytosis but urine is clear and shows no signs of infection or blood. Radiology: ordered and independent interpretation performed.    Details: Left ureteral stone, 17 mm  Risk Prescription drug management.   Patient presents with ureteral colic.  Feeling a lot better after fentanyl.  Still has some residual discomfort but is much more comfortable.  Will also give a dose of Toradol.  Normal renal function.  She has low potassium which she states is a recurrent issue but she has not been taking her potassium because she was put on losartan to help manage her low potassium.  Unfortunately she will need some potassium replete meant and I have instructed her on how to take this at home as she states she has plenty of potassium pills at home.  Will give a dose here.  She feels well enough for discharge and will give a prescription of Norco (states she can take narcotics as long as she has nausea medicine) and  advised ibuprofen.  I did discuss case with Dr. Benancio Deeds of urology, advised urine culture though if urine is clear we can hold off on antibiotics for now.  Otherwise call the urology office tomorrow to set up urgent appointment as she will need likely outpatient management but may need more emergent treatment if symptoms worsen.  I have discussed return precautions with patient.        Final Clinical Impression(s) / ED Diagnoses Final diagnoses:  Left ureteral stone  Hypokalemia    Rx / DC Orders ED Discharge Orders          Ordered    HYDROcodone-acetaminophen (NORCO) 5-325 MG tablet  Every 4 hours PRN        01/27/23 1550    ondansetron (ZOFRAN-ODT) 4 MG disintegrating tablet  Every 8 hours PRN        01/27/23 1550              Pricilla Loveless, MD 01/27/23 1553

## 2023-01-27 NOTE — ED Triage Notes (Signed)
Patient arrives ambulatory by POV c/o left sided flank pain x 2.5 hours. Reports hx of kidney stones. Reports hematuria for past few months and has appt with urology 6/6.

## 2023-01-27 NOTE — ED Notes (Signed)
Reviewed discharge instructions with pt. Pt states understanding. Pain controlled at discharge. Ambulatory . Daughter transport home

## 2023-01-27 NOTE — Discharge Instructions (Addendum)
You have a large kidney stone on the left side.  You will need to follow-up with urology and you should call [facility] tomorrow to set up an urgent appointment.  Let them know you were in the ER for this kidney stone.  You also have a low potassium.  You should take 20 mEq twice a day for the next 4 days.  You are being given hydrocodone for pain. Do not take your Tramadol in combination with this. You also be given nausea medicine.  You may still take ibuprofen and Tylenol to help with pain.  If you develop fever, new or worsening or uncontrolled pain, vomiting, urinary tract infection symptoms, or any other new/concerning symptoms and return to the ER or call 911.

## 2023-01-28 ENCOUNTER — Other Ambulatory Visit: Payer: Self-pay | Admitting: Urology

## 2023-01-28 DIAGNOSIS — N200 Calculus of kidney: Secondary | ICD-10-CM | POA: Diagnosis not present

## 2023-01-28 LAB — URINE CULTURE: Culture: NO GROWTH

## 2023-02-02 DIAGNOSIS — I10 Essential (primary) hypertension: Secondary | ICD-10-CM | POA: Diagnosis not present

## 2023-02-02 DIAGNOSIS — E785 Hyperlipidemia, unspecified: Secondary | ICD-10-CM | POA: Diagnosis not present

## 2023-02-02 NOTE — Progress Notes (Signed)
Talked with patient. Instructions given. Arrival time 1030, NPO after MN. Hx and meds reviewed. Driver  secured. Granddaughter will stay with her at night. Bring in blue folder

## 2023-02-04 ENCOUNTER — Encounter (HOSPITAL_BASED_OUTPATIENT_CLINIC_OR_DEPARTMENT_OTHER): Payer: Self-pay | Admitting: Urology

## 2023-02-04 ENCOUNTER — Ambulatory Visit (HOSPITAL_COMMUNITY): Payer: Medicare PPO

## 2023-02-04 ENCOUNTER — Other Ambulatory Visit: Payer: Self-pay

## 2023-02-04 ENCOUNTER — Ambulatory Visit (HOSPITAL_BASED_OUTPATIENT_CLINIC_OR_DEPARTMENT_OTHER)
Admission: RE | Admit: 2023-02-04 | Discharge: 2023-02-04 | Disposition: A | Discharge status: Home / Self Care | Payer: Medicare PPO | Attending: Urology | Admitting: Urology

## 2023-02-04 ENCOUNTER — Encounter (HOSPITAL_BASED_OUTPATIENT_CLINIC_OR_DEPARTMENT_OTHER)
Admission: RE | Disposition: A | Discharge status: Home / Self Care | Payer: Self-pay | Source: Home / Self Care | Attending: Urology

## 2023-02-04 DIAGNOSIS — N200 Calculus of kidney: Secondary | ICD-10-CM | POA: Diagnosis not present

## 2023-02-04 DIAGNOSIS — I10 Essential (primary) hypertension: Secondary | ICD-10-CM | POA: Diagnosis not present

## 2023-02-04 HISTORY — PX: EXTRACORPOREAL SHOCK WAVE LITHOTRIPSY: SHX1557

## 2023-02-04 SURGERY — LITHOTRIPSY, ESWL
Anesthesia: LOCAL | Laterality: Left

## 2023-02-04 MED ORDER — DIAZEPAM 5 MG PO TABS
ORAL_TABLET | ORAL | Status: AC
  Filled 2023-02-04: qty 2

## 2023-02-04 MED ORDER — TRAMADOL HCL 50 MG PO TABS: 50.0000 mg | ORAL_TABLET | Freq: Four times a day (QID) | ORAL | 0 refills | Status: AC | PRN

## 2023-02-04 MED ORDER — SODIUM CHLORIDE 0.9 % IV SOLN: INTRAVENOUS | Status: DC

## 2023-02-04 MED ORDER — CIPROFLOXACIN HCL 500 MG PO TABS
ORAL_TABLET | ORAL | Status: AC
  Filled 2023-02-04: qty 1

## 2023-02-04 MED ORDER — TAMSULOSIN HCL 0.4 MG PO CAPS: 0.4000 mg | ORAL_CAPSULE | Freq: Every day | ORAL | 0 refills | Status: DC

## 2023-02-04 MED ORDER — DIPHENHYDRAMINE HCL 25 MG PO CAPS
ORAL_CAPSULE | ORAL | Status: AC
  Filled 2023-02-04: qty 1

## 2023-02-04 MED ORDER — DIAZEPAM 5 MG PO TABS
10.0000 mg | ORAL_TABLET | ORAL | Status: AC
  Administered 2023-02-04: 10 mg via ORAL

## 2023-02-04 MED ORDER — BISACODYL 5 MG PO TBEC
5.0000 mg | DELAYED_RELEASE_TABLET | Freq: Once | ORAL | Status: DC
  Filled 2023-02-04: qty 1

## 2023-02-04 MED ORDER — DIPHENHYDRAMINE HCL 25 MG PO CAPS
25.0000 mg | ORAL_CAPSULE | ORAL | Status: AC
  Administered 2023-02-04: 25 mg via ORAL

## 2023-02-04 MED ORDER — CIPROFLOXACIN HCL 500 MG PO TABS
500.0000 mg | ORAL_TABLET | ORAL | Status: AC
  Administered 2023-02-04: 500 mg via ORAL

## 2023-02-04 NOTE — H&P (Signed)
New patient-   1) left renal pelvic stone-patient had left flank pain and underwent a CT scan abdomen and pelvis Jan 27, 2023. This revealed a 16 x 15 mm left renal pelvic stone. There was mild hydronephrosis. The stone was visible on the scout, HU 750, SSD 12 cm. White count was 10.7, creatinine 0.99, UA was negative. She had rare bacteria.   Today, pain is better. She is staying hydrated. She eats a lot of salt and chocolate.   KUB today with 18 mm left renal pelvic stone.   She passed a stone in 02/26/2014.   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     ALLERGIES: Codeine Derivatives    MEDICATIONS: Acid Reducer 150 mg tablet Oral  Activella 1 mg-0.5 mg tablet Oral  CoQ10 CAPS Oral  Estradiol-Norethindrone Acetat 1 mg-0.5 mg tablet  Fish Oil CAPS Oral  Flomax 0.4 mg capsule Oral  Hydrocodone-Acetaminophen 2.5 mg-325 mg tablet Oral  Losartan Potassium  Turmeric 500 mg capsule Oral  Vitamin D3 50 mcg (2,000 unit) capsule Oral  Zofran 8 mg tablet Oral     GU PSH: No GU PSH      PSH Notes: Uterine Surgery, Cholecystectomy   NON-GU PSH: Cholecystectomy (open) - 02/26/14     GU PMH: Ureteral calculus, Calculus of ureter - 02/26/2014 Hydronephrosis Unspec, Hydronephrosis - 02/26/14    NON-GU PMH: Encounter for general adult medical examination without abnormal findings, Encounter for preventive health examination - 2014/02/26 Personal history of other diseases of the circulatory system, History of hypertension - 02/26/2014, History of cardiac disorder, - 02-26-2014 Personal history of other diseases of the digestive system, History of esophageal reflux - Feb 26, 2014    FAMILY HISTORY: Death of family member - Runs In Family Myocardial Infarction - Runs In Family tetanus - Runs In Family   SOCIAL HISTORY: No Social History     Notes: Never a smoker, Widowed, Retired, Caffeine use, Number of children, Alcohol use   REVIEW OF SYSTEMS:    GU Review Female:   Patient denies frequent urination,  hard to postpone urination, burning /pain with urination, get up at night to urinate, leakage of urine, stream starts and stops, trouble starting your stream, have to strain to urinate, and being pregnant.  Gastrointestinal (Upper):   Patient denies nausea, vomiting, and indigestion/ heartburn.  Gastrointestinal (Lower):   Patient denies diarrhea and constipation.  Constitutional:   Patient denies fatigue, weight loss, fever, and night sweats.  Skin:   Patient denies skin rash/ lesion and itching.  Eyes:   Patient denies blurred vision and double vision.  Ears/ Nose/ Throat:   Patient denies sore throat and sinus problems.  Hematologic/Lymphatic:   Patient denies swollen glands and easy bruising.  Cardiovascular:   Patient denies leg swelling and chest pains.  Respiratory:   Patient denies cough and shortness of breath.  Endocrine:   Patient denies excessive thirst.  Musculoskeletal:   Patient denies back pain and joint pain.  Neurological:   Patient denies headaches and dizziness.  Psychologic:   Patient denies depression and anxiety.   VITAL SIGNS: None   GU PHYSICAL EXAMINATION:    External Genitalia: No hirsutism, no rash, no scarring, no cyst, no erythematous lesion, no papular lesion, no blanched lesion, no warty lesion. No edema.  Urethral Meatus: Normal size. Normal position. No discharge.  Urethra: No tenderness, no mass, no scarring. No hypermobility. No leakage.  Bladder: Normal to palpation, no tenderness, no mass,  normal size.  Vagina: No atrophy, no stenosis. No rectocele. No cystocele. No enterocele.  Cervix: No inflammation, no discharge, no lesion, no tenderness, no wart.  Uterus: Normal size. Normal consistency. Normal position. No mobility. No descent.  Anus and Perineum: No hemorrhoids. No anal stenosis. No rectal fissure, no anal fissure. No edema, no dimple, no perineal tenderness, no anal tenderness.   MULTI-SYSTEM PHYSICAL EXAMINATION:    Constitutional:  Well-nourished. No physical deformities. Normally developed. Good grooming.  Neck: Neck symmetrical, not swollen. Normal tracheal position.  Respiratory: No labored breathing, no use of accessory muscles.   Cardiovascular: Normal temperature, normal extremity pulses, no swelling, no varicosities.  Skin: No paleness, no jaundice, no cyanosis. No lesion, no ulcer, no rash.  Neurologic / Psychiatric: Oriented to time, oriented to place, oriented to person. No depression, no anxiety, no agitation.  Gastrointestinal: No mass, no tenderness, no rigidity, non obese abdomen.  Eyes: Normal conjunctivae. Normal eyelids.  Ears, Nose, Mouth, and Throat: Left ear no scars, no lesions, no masses. Right ear no scars, no lesions, no masses. Nose no scars, no lesions, no masses. Normal hearing. Normal lips.  Musculoskeletal: Normal gait and station of head and neck.     Complexity of Data:  X-Ray Review: C.T. Abdomen/Pelvis: Reviewed Films. Discussed With Patient. 2024    PROCEDURES:         KUB - 74018  A single view of the abdomen is obtained.  Calculi:  18 mm left renal pelvic stone.       The bones appeared normal. The bowel gas pattern appeared normal. The soft tissues were unremarkable. . Patient confirmed No Neulasta OnPro Device.           Urinalysis - 81003 Dipstick Dipstick Cont'd  Color: Yellow Bilirubin: Neg  Appearance: Clear Ketones: Neg  Specific Gravity: 1.015 Blood: Neg  pH: 7.5 Protein: Neg  Glucose: Neg Urobilinogen: 0.2    Nitrites: Neg    Leukocyte Esterase: Neg    Notes:      ASSESSMENT:      ICD-10 Details  1 GU:   Renal calculus - N20.0 Undiagnosed New Problem - I discussed with the patient the nature risks and benefits of continued stone passage, off label use of alpha blockers, shockwave lithotripsy or ureteroscopy. All questions answered. Proceed with shockwave lithotripsy. We discussed success rate could be lower need for staged procedure however given the size  of the stone, but stone is not very dense and this should help to balance out the success rate. Discussed a colleague may be doing the procedure.   We discussed dietary changes to prevent stones.   PLAN:           Orders X-Rays: KUB          Schedule Return Visit/Planned Activity: Next Available Appointment - Schedule Surgery

## 2023-02-04 NOTE — Discharge Instructions (Addendum)
See [facility] discharge instructions in chart.   Post Anesthesia Home Care Instructions  Activity: Get plenty of rest for the remainder of the day. A responsible individual must stay with you for 24 hours following the procedure.  For the next 24 hours, DO NOT: -Drive a car -Operate machinery -Drink alcoholic beverages -Take any medication unless instructed by your physician -Make any legal decisions or sign important papers.  Meals: Start with liquid foods such as gelatin or soup. Progress to regular foods as tolerated. Avoid greasy, spicy, heavy foods. If nausea and/or vomiting occur, drink only clear liquids until the nausea and/or vomiting subsides. Call your physician if vomiting continues.  Special Instructions/Symptoms: Your throat may feel dry or sore from the anesthesia or the breathing tube placed in your throat during surgery. If this causes discomfort, gargle with warm salt water. The discomfort should disappear within 24 hours.       

## 2023-02-04 NOTE — Op Note (Signed)
See [facility] OP note scanned into chart. Also because of the size, density, location and other factors that cannot be anticipated I feel this will likely be a staged procedure. This fact supersedes any indication in the scanned [facility] operative note to the contrary.  

## 2023-02-05 ENCOUNTER — Other Ambulatory Visit: Payer: Self-pay

## 2023-02-05 ENCOUNTER — Observation Stay (HOSPITAL_COMMUNITY)
Admission: EM | Admit: 2023-02-05 | Discharge: 2023-02-06 | Disposition: A | Discharge status: Home / Self Care | Payer: Medicare PPO | Attending: Internal Medicine | Admitting: Internal Medicine

## 2023-02-05 ENCOUNTER — Encounter (HOSPITAL_COMMUNITY): Payer: Self-pay

## 2023-02-05 ENCOUNTER — Emergency Department (HOSPITAL_COMMUNITY): Payer: Medicare PPO

## 2023-02-05 DIAGNOSIS — Z91040 Latex allergy status: Secondary | ICD-10-CM | POA: Diagnosis not present

## 2023-02-05 DIAGNOSIS — Z85828 Personal history of other malignant neoplasm of skin: Secondary | ICD-10-CM | POA: Insufficient documentation

## 2023-02-05 DIAGNOSIS — E876 Hypokalemia: Secondary | ICD-10-CM | POA: Diagnosis not present

## 2023-02-05 DIAGNOSIS — N179 Acute kidney failure, unspecified: Secondary | ICD-10-CM | POA: Diagnosis not present

## 2023-02-05 DIAGNOSIS — N201 Calculus of ureter: Secondary | ICD-10-CM | POA: Insufficient documentation

## 2023-02-05 DIAGNOSIS — E785 Hyperlipidemia, unspecified: Secondary | ICD-10-CM

## 2023-02-05 DIAGNOSIS — N400 Enlarged prostate without lower urinary tract symptoms: Secondary | ICD-10-CM

## 2023-02-05 DIAGNOSIS — N182 Chronic kidney disease, stage 2 (mild): Secondary | ICD-10-CM | POA: Diagnosis not present

## 2023-02-05 DIAGNOSIS — N202 Calculus of kidney with calculus of ureter: Secondary | ICD-10-CM | POA: Insufficient documentation

## 2023-02-05 DIAGNOSIS — I10 Essential (primary) hypertension: Secondary | ICD-10-CM | POA: Diagnosis present

## 2023-02-05 DIAGNOSIS — N3001 Acute cystitis with hematuria: Secondary | ICD-10-CM | POA: Diagnosis not present

## 2023-02-05 DIAGNOSIS — Z79899 Other long term (current) drug therapy: Secondary | ICD-10-CM | POA: Insufficient documentation

## 2023-02-05 DIAGNOSIS — I129 Hypertensive chronic kidney disease with stage 1 through stage 4 chronic kidney disease, or unspecified chronic kidney disease: Secondary | ICD-10-CM | POA: Diagnosis not present

## 2023-02-05 DIAGNOSIS — N139 Obstructive and reflux uropathy, unspecified: Secondary | ICD-10-CM

## 2023-02-05 DIAGNOSIS — R109 Unspecified abdominal pain: Secondary | ICD-10-CM | POA: Diagnosis not present

## 2023-02-05 DIAGNOSIS — R7401 Elevation of levels of liver transaminase levels: Secondary | ICD-10-CM

## 2023-02-05 DIAGNOSIS — N200 Calculus of kidney: Principal | ICD-10-CM

## 2023-02-05 DIAGNOSIS — N3000 Acute cystitis without hematuria: Secondary | ICD-10-CM

## 2023-02-05 DIAGNOSIS — N132 Hydronephrosis with renal and ureteral calculous obstruction: Secondary | ICD-10-CM | POA: Diagnosis not present

## 2023-02-05 DIAGNOSIS — K5730 Diverticulosis of large intestine without perforation or abscess without bleeding: Secondary | ICD-10-CM | POA: Diagnosis not present

## 2023-02-05 DIAGNOSIS — R112 Nausea with vomiting, unspecified: Secondary | ICD-10-CM

## 2023-02-05 LAB — URINALYSIS, ROUTINE W REFLEX MICROSCOPIC
Bilirubin Urine: NEGATIVE
Glucose, UA: NEGATIVE mg/dL
Ketones, ur: 20 mg/dL — AB
Nitrite: NEGATIVE
Protein, ur: 30 mg/dL — AB
RBC / HPF: >50 RBC/hpf (ref 0–5)
Specific Gravity, Urine: 1.015 (ref 1.005–1.030)
pH: 7 (ref 5.0–8.0)

## 2023-02-05 LAB — CBC WITH DIFFERENTIAL/PLATELET
Abs Immature Granulocytes: 0.06 10*3/uL (ref 0.00–0.07)
Basophils Absolute: 0.1 10*3/uL (ref 0.0–0.1)
Basophils Relative: 0 %
Eosinophils Absolute: 0 10*3/uL (ref 0.0–0.5)
Eosinophils Relative: 0 %
HCT: 42.4 % (ref 36.0–46.0)
Hemoglobin: 15.1 g/dL — ABNORMAL HIGH (ref 12.0–15.0)
Immature Granulocytes: 1 %
Lymphocytes Relative: 11 %
Lymphs Abs: 1.2 10*3/uL (ref 0.7–4.0)
MCH: 30.7 pg (ref 26.0–34.0)
MCHC: 35.6 g/dL (ref 30.0–36.0)
MCV: 86.2 fL (ref 80.0–100.0)
Monocytes Absolute: 0.7 10*3/uL (ref 0.1–1.0)
Monocytes Relative: 6 %
Neutro Abs: 9.4 10*3/uL — ABNORMAL HIGH (ref 1.7–7.7)
Neutrophils Relative %: 82 %
Platelets: 218 10*3/uL (ref 150–400)
RBC: 4.92 MIL/uL (ref 3.87–5.11)
RDW: 12.8 % (ref 11.5–15.5)
WBC: 11.4 10*3/uL — ABNORMAL HIGH (ref 4.0–10.5)
nRBC: 0 % (ref 0.0–0.2)

## 2023-02-05 LAB — COMPREHENSIVE METABOLIC PANEL
ALT: 59 U/L — ABNORMAL HIGH (ref 0–44)
AST: 69 U/L — ABNORMAL HIGH (ref 15–41)
Albumin: 4.2 g/dL (ref 3.5–5.0)
Alkaline Phosphatase: 62 U/L (ref 38–126)
Anion gap: 14 (ref 5–15)
BUN: 13 mg/dL (ref 8–23)
CO2: 21 mmol/L — ABNORMAL LOW (ref 22–32)
Calcium: 9.4 mg/dL (ref 8.9–10.3)
Chloride: 97 mmol/L — ABNORMAL LOW (ref 98–111)
Creatinine, Ser: 1.33 mg/dL — ABNORMAL HIGH (ref 0.44–1.00)
GFR, Estimated: 43 mL/min — ABNORMAL LOW (ref >60)
Glucose, Bld: 108 mg/dL — ABNORMAL HIGH (ref 70–99)
Potassium: 2.8 mmol/L — ABNORMAL LOW (ref 3.5–5.1)
Sodium: 132 mmol/L — ABNORMAL LOW (ref 135–145)
Total Bilirubin: 1.7 mg/dL — ABNORMAL HIGH (ref 0.3–1.2)
Total Protein: 7.5 g/dL (ref 6.5–8.1)

## 2023-02-05 LAB — LACTIC ACID, PLASMA: Lactic Acid, Venous: 0.7 mmol/L (ref 0.5–1.9)

## 2023-02-05 LAB — MAGNESIUM: Magnesium: 1.7 mg/dL (ref 1.7–2.4)

## 2023-02-05 MED ORDER — POTASSIUM CHLORIDE IN NACL 20-0.9 MEQ/L-% IV SOLN
INTRAVENOUS | Status: DC
  Filled 2023-02-05 (×2): qty 1000

## 2023-02-05 MED ORDER — HEPARIN SODIUM (PORCINE) 5000 UNIT/ML IJ SOLN
5000.0000 [IU] | Freq: Three times a day (TID) | INTRAMUSCULAR | Status: DC
  Administered 2023-02-05: 5000 [IU] via SUBCUTANEOUS
  Filled 2023-02-05 (×2): qty 1

## 2023-02-05 MED ORDER — FENTANYL CITRATE PF 50 MCG/ML IJ SOSY
50.0000 ug | PREFILLED_SYRINGE | Freq: Once | INTRAMUSCULAR | Status: AC
  Administered 2023-02-05: 50 ug via INTRAVENOUS
  Filled 2023-02-05: qty 1

## 2023-02-05 MED ORDER — ROSUVASTATIN CALCIUM 5 MG PO TABS: 5.0000 mg | ORAL_TABLET | Freq: Every day | ORAL | Status: DC

## 2023-02-05 MED ORDER — ACETAMINOPHEN 650 MG RE SUPP: 650.0000 mg | Freq: Four times a day (QID) | RECTAL | Status: DC | PRN

## 2023-02-05 MED ORDER — KETOROLAC TROMETHAMINE 15 MG/ML IJ SOLN
15.0000 mg | Freq: Once | INTRAMUSCULAR | Status: AC
  Administered 2023-02-05: 15 mg via INTRAVENOUS
  Filled 2023-02-05: qty 1

## 2023-02-05 MED ORDER — HYDROMORPHONE HCL 1 MG/ML IJ SOLN
1.0000 mg | Freq: Once | INTRAMUSCULAR | Status: AC
  Administered 2023-02-05: 1 mg via INTRAVENOUS
  Filled 2023-02-05: qty 1

## 2023-02-05 MED ORDER — ONDANSETRON HCL 4 MG/2ML IJ SOLN
4.0000 mg | Freq: Four times a day (QID) | INTRAMUSCULAR | Status: DC | PRN
  Administered 2023-02-06: 4 mg via INTRAVENOUS
  Filled 2023-02-05: qty 2

## 2023-02-05 MED ORDER — SENNOSIDES-DOCUSATE SODIUM 8.6-50 MG PO TABS: 1.0000 | ORAL_TABLET | Freq: Every evening | ORAL | Status: DC | PRN

## 2023-02-05 MED ORDER — ACETAMINOPHEN 325 MG PO TABS
650.0000 mg | ORAL_TABLET | Freq: Four times a day (QID) | ORAL | Status: DC | PRN
  Administered 2023-02-06: 650 mg via ORAL
  Filled 2023-02-05: qty 2

## 2023-02-05 MED ORDER — MORPHINE SULFATE (PF) 2 MG/ML IV SOLN: 2.0000 mg | INTRAVENOUS | Status: DC | PRN

## 2023-02-05 MED ORDER — SODIUM CHLORIDE 0.9 % IV SOLN
2.0000 g | Freq: Once | INTRAVENOUS | Status: AC
  Administered 2023-02-05: 2 g via INTRAVENOUS
  Filled 2023-02-05: qty 12.5

## 2023-02-05 MED ORDER — ONDANSETRON HCL 4 MG/2ML IJ SOLN
4.0000 mg | Freq: Once | INTRAMUSCULAR | Status: AC
  Administered 2023-02-05: 4 mg via INTRAVENOUS
  Filled 2023-02-05: qty 2

## 2023-02-05 MED ORDER — POTASSIUM CHLORIDE CRYS ER 20 MEQ PO TBCR
40.0000 meq | EXTENDED_RELEASE_TABLET | ORAL | Status: AC
  Administered 2023-02-06 (×2): 40 meq via ORAL
  Filled 2023-02-05: qty 2

## 2023-02-05 MED ORDER — SODIUM CHLORIDE 0.9 % IV SOLN: 2.0000 g | INTRAVENOUS | Status: DC

## 2023-02-05 MED ORDER — LOSARTAN POTASSIUM 50 MG PO TABS: 25.0000 mg | ORAL_TABLET | Freq: Every day | ORAL | Status: DC

## 2023-02-05 NOTE — ED Notes (Signed)
ED TO INPATIENT HANDOFF REPORT  ED Nurse Name and Phone #: Darnell Level Name/Age/Gender Melissa Cole 72 y.o. female Room/Bed: WA08/WA08  Code Status   Code Status: Full Code  Home/SNF/Other Home Patient oriented to: self, place, time, and situation Is this baseline? Yes   Triage Complete: Triage complete  Chief Complaint Obstructive uropathy [N13.9]  Triage Note Patient coming to ED for evaluation of flank pain s/p lithotripsy on Friday.  Reports she is unable to "keep the pain medication down."  Having severe pain, nausea, and vomiting.  No reports of fever.     Allergies Allergies  Allergen Reactions   Amlodipine Cough    Other reaction(s): OTHER Other reaction(s): OTHER    Benazepril Other (See Comments)    Other reaction(s): RASH Other reaction(s): RASH Other reaction(s): RASH    Codeine Other (See Comments)    Stomach pain    Iodine Itching   Mushroom Ext Cmplx(Shiitake-Reishi-Mait) Nausea Only   Other Nausea Only    Paint,  Cig. smoke   Shiitake Mushroom    Latex Rash   Oxycodone-Acetaminophen Nausea Only   Tape Rash    Adhesive Tape    Level of Care/Admitting Diagnosis ED Disposition     ED Disposition  Admit   Condition  --   Comment  Hospital Area: [facility] [[id number]]  Level of Care: Med-Surg [16]  May admit patient to [facility] or [facility] if equivalent level of care is available:: No  Covid Evaluation: Confirmed COVID Negative  Diagnosis: Obstructive uropathy [301303]  Admitting Physician: Gery Pray [[id number]]  Attending Physician: Gery Pray [[id number]]  Certification:: I certify this patient will need inpatient services for at least 2 midnights  Estimated Length of Stay: 2          B Medical/Surgery History Past Medical History:  Diagnosis Date   Allergy    Basal cell carcinoma    Blood transfusion without reported diagnosis 1969   Cataract    Gallstones    Hepatic cyst    Hypertension    Kidney  stones    Mitral valve prolapse    Post-operative nausea and vomiting    Stage 2 chronic kidney disease    Uterine fibroids affecting pregnancy    Past Surgical History:  Procedure Laterality Date   BREAST BIOPSY Left 01/12/2020   CHOLECYSTECTOMY  1998   COLONOSCOPY  12/24/2014   MITRAL VALVE PROLAPS     MOHS SURGERY     basal    POLYPECTOMY     SQUAMOUS CELL CARCINOMA EXCISION       A IV Location/Drains/Wounds Patient Lines/Drains/Airways Status     Active Line/Drains/Airways     Name Placement date Placement time Site Days   Peripheral IV 02/05/23 20 G Left Antecubital 02/05/23  2046  Antecubital  less than 1            Intake/Output Last 24 hours No intake or output data in the 24 hours ending 02/05/23 2220  Labs/Imaging Results for orders placed or performed during the hospital encounter of 02/05/23 (from the past 48 hour(s))  Urinalysis, Routine w reflex microscopic -Urine, Clean Catch     Status: Abnormal   Collection Time: 02/05/23  7:36 PM  Result Value Ref Range   Color, Urine YELLOW YELLOW   APPearance HAZY (A) CLEAR   Specific Gravity, Urine 1.015 1.005 - 1.030   pH 7.0 5.0 - 8.0   Glucose, UA NEGATIVE NEGATIVE mg/dL   Hgb urine  dipstick LARGE (A) NEGATIVE   Bilirubin Urine NEGATIVE NEGATIVE   Ketones, ur 20 (A) NEGATIVE mg/dL   Protein, ur 30 (A) NEGATIVE mg/dL   Nitrite NEGATIVE NEGATIVE   Leukocytes,Ua MODERATE (A) NEGATIVE   RBC / HPF >50 0 - 5 RBC/hpf   WBC, UA 21-50 0 - 5 WBC/hpf   Bacteria, UA RARE (A) NONE SEEN   Squamous Epithelial / HPF 0-5 0 - 5 /HPF   Mucus PRESENT    Hyaline Casts, UA PRESENT     Comment: Performed at [facility], [address], [place], [place] [address]  CBC with Differential     Status: Abnormal   Collection Time: 02/05/23  8:51 PM  Result Value Ref Range   WBC 11.4 (H) 4.0 - 10.5 K/uL   RBC 4.92 3.87 - 5.11 MIL/uL   Hemoglobin 15.1 (H) 12.0 - 15.0 g/dL   HCT 55.7 32.2 - 02.5 %   MCV  86.2 80.0 - 100.0 fL   MCH 30.7 26.0 - 34.0 pg   MCHC 35.6 30.0 - 36.0 g/dL   RDW 42.7 06.2 - 37.6 %   Platelets 218 150 - 400 K/uL   nRBC 0.0 0.0 - 0.2 %   Neutrophils Relative % 82 %   Neutro Abs 9.4 (H) 1.7 - 7.7 K/uL   Lymphocytes Relative 11 %   Lymphs Abs 1.2 0.7 - 4.0 K/uL   Monocytes Relative 6 %   Monocytes Absolute 0.7 0.1 - 1.0 K/uL   Eosinophils Relative 0 %   Eosinophils Absolute 0.0 0.0 - 0.5 K/uL   Basophils Relative 0 %   Basophils Absolute 0.1 0.0 - 0.1 K/uL   Immature Granulocytes 1 %   Abs Immature Granulocytes 0.06 0.00 - 0.07 K/uL    Comment: Performed at [facility], [address], [place], [place] [address]  Comprehensive metabolic panel     Status: Abnormal   Collection Time: 02/05/23  8:51 PM  Result Value Ref Range   Sodium 132 (L) 135 - 145 mmol/L   Potassium 2.8 (L) 3.5 - 5.1 mmol/L   Chloride 97 (L) 98 - 111 mmol/L   CO2 21 (L) 22 - 32 mmol/L   Glucose, Bld 108 (H) 70 - 99 mg/dL    Comment: Glucose reference range applies only to samples taken after fasting for at least 8 hours.   BUN 13 8 - 23 mg/dL   Creatinine, Ser 1.76 (H) 0.44 - 1.00 mg/dL   Calcium 9.4 8.9 - 16.0 mg/dL   Total Protein 7.5 6.5 - 8.1 g/dL   Albumin 4.2 3.5 - 5.0 g/dL   AST 69 (H) 15 - 41 U/L   ALT 59 (H) 0 - 44 U/L   Alkaline Phosphatase 62 38 - 126 U/L   Total Bilirubin 1.7 (H) 0.3 - 1.2 mg/dL   GFR, Estimated 43 (L) >60 mL/min    Comment: (NOTE) Calculated using the CKD-EPI Creatinine Equation (2021)    Anion gap 14 5 - 15    Comment: Performed at [facility], [address], [place], [place] [address]   DG Abd 1 View  Result Date: 02/05/2023 CLINICAL DATA:  Evaluate left renal stone burden lithotripsy. [id number]. EXAM: ABDOMEN - 1 VIEW COMPARISON:  KUB 01/28/2023 FINDINGS: Again noted is a laminated 1.9 cm left renal pelvis stone. No other nephrolithiasis is seen. There are right upper quadrant cholecystectomy clips. The bowel  pattern is normal. There are few pelvic phleboliths. There are  no other visible pathologic calcifications or other significant radiographic findings. Advanced degenerative change lumbar spine. IMPRESSION: 1.9 cm left renal pelvis stone. No other visible pathologic calcifications. Electronically Signed   By: Almira Bar M.D.   On: 02/05/2023 21:10   CT Renal Stone Study  Result Date: 02/05/2023 CLINICAL DATA:  Left flank pain following lithotripsy yesterday. Nausea and unable to keep pain medication down. No report of fever. EXAM: CT ABDOMEN AND PELVIS WITHOUT CONTRAST TECHNIQUE: Multidetector CT imaging of the abdomen and pelvis was performed following the standard protocol without IV contrast. RADIATION DOSE REDUCTION: This exam was performed according to the departmental dose-optimization program which includes automated exposure control, adjustment of the mA and/or kV according to patient size and/or use of iterative reconstruction technique. COMPARISON:  CT without contrast 01/27/2023, CT with contrast 01/24/2014 FINDINGS: Lower chest: There is right lower lobe paraspinal subpleural fibrosis and bronchiolectasis. Scattered linear scar-like opacities in the lung bases and bilateral posterior diaphragmatic fat herniations. Lung bases are clear of infiltrates.  The cardiac size is normal. There is calcification in the right coronary artery. Small hiatal hernia. Hepatobiliary: There is a subcentimeter chronic cyst in the dome of hepatic segment 2. The unenhanced liver is otherwise unremarkable. Gallbladder is absent without biliary dilatation. Pancreas: Unremarkable without contrast. Spleen: Unremarkable without contrast. Small splenules are noted anterior to the base of the spleen. Adrenals/Urinary Tract: There is no adrenal mass. On the right no stone is seen or hydronephrosis. Bilaterally no contour deforming mass of either kidney. On the left there is increased moderate hydronephrosis and perinephric  stranding, trace perinephric fluid without focal collection. There previously was a solitary 1.7 cm UPJ stone. Currently there are multiple punctate up to 2 mm stones in the left renal collecting system, larger 4 mm stone inferiorly and a 5 mm UPJ stone on 2:36. There is moderate left hydroureter as well with the distal 7 cm of the left ureter filled with innumerable tiny stone fragments and sand like particles up to the UVJ. There are no intravesical stones or bladder thickening. The bladder unremarkable accounting for the degree of distention. Stomach/Bowel: Unremarkable unopacified stomach and small bowel. Normal retrocecal appendix. Scattered colonic diverticulosis without evidence of diverticulitis or colitis. Vascular/Lymphatic: Aortic atherosclerosis. No enlarged abdominal or pelvic lymph nodes. Reproductive: Small calcified fundal uterine fibroid. The uterus and ovaries are otherwise unremarkable. Other: There is no pelvic ascites. There is no free hemorrhage, free air or abscess. As above there is only trace perinephric fluid. No space-occupying urinoma. There are no incarcerated hernias. Musculoskeletal: Again noted is advanced degenerative disc collapse at L2-3 and L4-5 with reactive endplate sclerosis, lesser degenerative change elsewhere in the spine. No acute or other significant osseous findings. IMPRESSION: 1. 5 mm left UPJ stone with moderate obstructive uropathy and perinephric stranding. Trace perinephric fluid without focal collection. Please correlate clinically for infectious complication. 2. The distal 7 cm of the left ureter is filled with innumerable tiny stone fragments and sand-like particles up to the UVJ. 3. Additional multiple multifocal small stones in the left renal collecting system. 4. Aortic and coronary artery atherosclerosis. 5. Diverticulosis without evidence of diverticulitis. 6. Small hiatal hernia. Aortic Atherosclerosis (ICD10-I70.0). Electronically Signed   By: Almira Bar M.D.   On: 02/05/2023 21:07    Pending Labs Unresulted Labs (From admission, onward)     Start     Ordered   02/06/23 0500  Comprehensive metabolic panel  Tomorrow morning,   R  02/05/23 2205   02/06/23 0500  CBC with Differential/Platelet  Tomorrow morning,   R        02/05/23 2205   02/05/23 2204  Lactic acid, plasma  STAT Now then every 3 hours,   R (with STAT occurrences)      02/05/23 2203   02/05/23 2204  CBC  (heparin)  Once,   R       Comments: Baseline for heparin therapy IF NOT ALREADY DRAWN.  Notify MD if PLT < 100 K.    02/05/23 2205   02/05/23 2204  Creatinine, serum  (heparin)  Once,   R       Comments: Baseline for heparin therapy IF NOT ALREADY DRAWN.    02/05/23 2205   02/05/23 2157  Magnesium  Once,   STAT        02/05/23 2156   02/05/23 2137  Blood culture (routine x 2)  BLOOD CULTURE X 2,   R (with STAT occurrences)      02/05/23 2136   02/05/23 2136  Urinalysis, w/ Reflex to Culture (Infection Suspected) -Urine, Clean Catch  (Urine Labs)  Once,   URGENT       Question:  Specimen Source  Answer:  Urine, Clean Catch   02/05/23 2136            Vitals/Pain Today's Vitals   02/05/23 2045 02/05/23 2107 02/05/23 2130 02/05/23 2134  BP: (!) 165/79  (!) 141/64   Pulse: 98  95   Resp: 19  17   Temp:      TempSrc:      SpO2: 98%  94%   Weight:      Height:      PainSc:  8   0-No pain    Isolation Precautions No active isolations  Medications Medications  ceFEPIme (MAXIPIME) 2 g in sodium chloride 0.9 % 100 mL IVPB (2 g Intravenous New Bag/Given 02/05/23 2209)  heparin injection 5,000 Units (5,000 Units Subcutaneous Given 02/05/23 2213)  acetaminophen (TYLENOL) tablet 650 mg (has no administration in time range)    Or  acetaminophen (TYLENOL) suppository 650 mg (has no administration in time range)  senna-docusate (Senokot-S) tablet 1 tablet (has no administration in time range)  cefTRIAXone (ROCEPHIN) 2 g in sodium chloride 0.9 % 100 mL  IVPB (has no administration in time range)  morphine (PF) 2 MG/ML injection 2 mg (has no administration in time range)  ondansetron (ZOFRAN) injection 4 mg (has no administration in time range)  ketorolac (TORADOL) 15 MG/ML injection 15 mg (has no administration in time range)  fentaNYL (SUBLIMAZE) injection 50 mcg (50 mcg Intravenous Given 02/05/23 2047)  ondansetron (ZOFRAN) injection 4 mg (4 mg Intravenous Given 02/05/23 2047)  HYDROmorphone (DILAUDID) injection 1 mg (1 mg Intravenous Given 02/05/23 2113)    Mobility walks     Focused Assessments genitourinary   R Recommendations: See Admitting Provider Note  Report given to:   Additional Notes: n/a

## 2023-02-05 NOTE — ED Triage Notes (Signed)
Patient coming to ED for evaluation of flank pain s/p lithotripsy on Friday.  Reports she is unable to "keep the pain medication down."  Having severe pain, nausea, and vomiting.  No reports of fever.

## 2023-02-05 NOTE — ED Provider Notes (Signed)
EMERGENCY DEPARTMENT AT [facility] Provider Note   CSN: [id number] Arrival date & time: 02/05/23  4098     History  Chief Complaint  Patient presents with   Flank Pain    Melissa Cole is a 72 y.o. female past medical history of hypertension, CKD, kidney stones presented today for evaluation of abdominal pain.  Patient had a lithotripsy yesterday by Dr. Marlou Porch.  She reports increased abdominal pain, nausea and vomiting after the procedure.  She has tried oxycodone but with vomiting she was unable to keep the medication down.  Denies fever.  CT scan done on 01/27/2023 show 17 mm obstructing stone at the left ureteral pelvic junction with associated mild left hydronephrosis.    Flank Pain      Past Medical History:  Diagnosis Date   Allergy    Basal cell carcinoma    Blood transfusion without reported diagnosis 1969   Cataract    Gallstones    Hepatic cyst    Hypertension    Kidney stones    Mitral valve prolapse    Post-operative nausea and vomiting    Stage 2 chronic kidney disease    Uterine fibroids affecting pregnancy    Past Surgical History:  Procedure Laterality Date   BREAST BIOPSY Left 01/12/2020   CHOLECYSTECTOMY  1998   COLONOSCOPY  12/24/2014   MITRAL VALVE PROLAPS     MOHS SURGERY     basal    POLYPECTOMY     SQUAMOUS CELL CARCINOMA EXCISION       Home Medications Prior to Admission medications   Medication Sig Start Date End Date Taking? Authorizing Provider  ACTIVELLA 1-0.5 MG per tablet Take 1 tablet by mouth 3 (three) times a week. Once daily 03/18/11   [provider]  Cholecalciferol (VITAMIN D3 PO) Take by mouth.    [provider]  co-enzyme Q-10 30 MG capsule Take 30 mg by mouth 3 (three) times daily.    [provider]  Cyanocobalamin (VITAMIN B-12 PO) Take 3,000 mcg by mouth daily.    [provider]  estradiol (ESTRACE) 0.5 MG tablet Take 0.5 mg by mouth daily.    [provider]  fexofenadine (ALLEGRA) 180 MG tablet Take 180 mg by mouth daily.    [provider]  losartan (COZAAR) 25 MG tablet Take 25 mg by mouth daily.    [provider]  MAGNESIUM-POTASSIUM PO Take by mouth.    [provider]  niacinamide 500 MG tablet Take 500 mg by mouth.    [provider]  nystatin-triamcinolone ointment (MYCOLOG) Apply 1 application topically 2 (two) times daily. 10/23/21   Vivi Barrack, DPM  ondansetron (ZOFRAN-ODT) 4 MG disintegrating tablet Take 1 tablet (4 mg total) by mouth every 8 (eight) hours as needed for nausea or vomiting. 01/27/23   Pricilla Loveless, MD  OVER THE COUNTER MEDICATION Take 2 capsules by mouth daily. Ultimate Oil (fish oil supplement) 1280 mg + CoQQ 10    [provider]  rosuvastatin (CRESTOR) 20 MG tablet Take 20 mg by mouth daily. Not sure about name of medication    [provider]  tamsulosin (FLOMAX) 0.4 MG CAPS capsule Take 1 capsule (0.4 mg total) by mouth daily. 02/04/23   Crist Fat, MD  traMADol (ULTRAM) 50 MG tablet Take 1 tablet (50 mg total) by mouth every 6 (six) hours as needed. 02/04/23 02/04/24  Crist Fat, MD  TraMADol HCl 100 MG  CP24 Take by mouth as needed.    [provider]  triamterene-hydrochlorothiazide (MAXZIDE) 75-50 MG per tablet Take 0.5 tablets by mouth daily. Once daily 03/19/11   [provider]      Allergies    Amlodipine, Benazepril, Codeine, Iodine, Mushroom ext cmplx(shiitake-reishi-mait), Other, Shiitake mushroom, Latex, Oxycodone-acetaminophen, and Tape    Review of Systems   Review of Systems  Genitourinary:  Positive for flank pain.    Physical Exam Updated Vital Signs BP (!) 141/64   Pulse 95   Temp 98.3 F (36.8 C) (Oral)   Resp 17   Ht 5\' 4"  (1.626 m)   Wt 69.4 kg   SpO2 94%   BMI 26.26 kg/m  Physical Exam Vitals and nursing note reviewed.  Constitutional:      Appearance: Normal appearance.   HENT:     Head: Normocephalic and atraumatic.     Mouth/Throat:     Mouth: Mucous membranes are moist.  Eyes:     General: No scleral icterus. Cardiovascular:     Rate and Rhythm: Normal rate and regular rhythm.     Pulses: Normal pulses.     Heart sounds: Normal heart sounds.  Pulmonary:     Effort: Pulmonary effort is normal.     Breath sounds: Normal breath sounds.  Abdominal:     General: Abdomen is flat.     Palpations: Abdomen is soft.     Tenderness: There is no abdominal tenderness.  Musculoskeletal:        General: No deformity.  Skin:    General: Skin is warm.     Findings: No rash.  Neurological:     General: No focal deficit present.     Mental Status: She is alert.  Psychiatric:        Mood and Affect: Mood normal.     ED Results / Procedures / Treatments   Labs (all labs ordered are listed, but only abnormal results are displayed) Labs Reviewed  URINALYSIS, ROUTINE W REFLEX MICROSCOPIC - Abnormal; Notable for the following components:      Result Value   APPearance HAZY (*)    Hgb urine dipstick LARGE (*)    Ketones, ur 20 (*)    Protein, ur 30 (*)    Leukocytes,Ua MODERATE (*)    Bacteria, UA RARE (*)    All other components within normal limits  CBC WITH DIFFERENTIAL/PLATELET - Abnormal; Notable for the following components:   WBC 11.4 (*)    Hemoglobin 15.1 (*)    Neutro Abs 9.4 (*)    All other components within normal limits  COMPREHENSIVE METABOLIC PANEL - Abnormal; Notable for the following components:   Sodium 132 (*)    Potassium 2.8 (*)    Chloride 97 (*)    CO2 21 (*)    Glucose, Bld 108 (*)    Creatinine, Ser 1.33 (*)    AST 69 (*)    ALT 59 (*)    Total Bilirubin 1.7 (*)    GFR, Estimated 43 (*)    All other components within normal limits  CULTURE, BLOOD (ROUTINE X 2)  CULTURE, BLOOD (ROUTINE X 2)  URINALYSIS, W/ REFLEX TO CULTURE (INFECTION SUSPECTED)  MAGNESIUM    EKG None  Radiology DG Abd 1 View  Result Date:  02/05/2023 CLINICAL DATA:  Evaluate left renal stone burden lithotripsy. [id number]. EXAM: ABDOMEN - 1 VIEW COMPARISON:  KUB 01/28/2023 FINDINGS: Again noted is a laminated 1.9 cm left renal pelvis stone. No  other nephrolithiasis is seen. There are right upper quadrant cholecystectomy clips. The bowel pattern is normal. There are few pelvic phleboliths. There are no other visible pathologic calcifications or other significant radiographic findings. Advanced degenerative change lumbar spine. IMPRESSION: 1.9 cm left renal pelvis stone. No other visible pathologic calcifications. Electronically Signed   By: Almira Bar M.D.   On: 02/05/2023 21:10   CT Renal Stone Study  Result Date: 02/05/2023 CLINICAL DATA:  Left flank pain following lithotripsy yesterday. Nausea and unable to keep pain medication down. No report of fever. EXAM: CT ABDOMEN AND PELVIS WITHOUT CONTRAST TECHNIQUE: Multidetector CT imaging of the abdomen and pelvis was performed following the standard protocol without IV contrast. RADIATION DOSE REDUCTION: This exam was performed according to the departmental dose-optimization program which includes automated exposure control, adjustment of the mA and/or kV according to patient size and/or use of iterative reconstruction technique. COMPARISON:  CT without contrast 01/27/2023, CT with contrast 01/24/2014 FINDINGS: Lower chest: There is right lower lobe paraspinal subpleural fibrosis and bronchiolectasis. Scattered linear scar-like opacities in the lung bases and bilateral posterior diaphragmatic fat herniations. Lung bases are clear of infiltrates.  The cardiac size is normal. There is calcification in the right coronary artery. Small hiatal hernia. Hepatobiliary: There is a subcentimeter chronic cyst in the dome of hepatic segment 2. The unenhanced liver is otherwise unremarkable. Gallbladder is absent without biliary dilatation. Pancreas: Unremarkable without contrast. Spleen: Unremarkable without  contrast. Small splenules are noted anterior to the base of the spleen. Adrenals/Urinary Tract: There is no adrenal mass. On the right no stone is seen or hydronephrosis. Bilaterally no contour deforming mass of either kidney. On the left there is increased moderate hydronephrosis and perinephric stranding, trace perinephric fluid without focal collection. There previously was a solitary 1.7 cm UPJ stone. Currently there are multiple punctate up to 2 mm stones in the left renal collecting system, larger 4 mm stone inferiorly and a 5 mm UPJ stone on 2:36. There is moderate left hydroureter as well with the distal 7 cm of the left ureter filled with innumerable tiny stone fragments and sand like particles up to the UVJ. There are no intravesical stones or bladder thickening. The bladder unremarkable accounting for the degree of distention. Stomach/Bowel: Unremarkable unopacified stomach and small bowel. Normal retrocecal appendix. Scattered colonic diverticulosis without evidence of diverticulitis or colitis. Vascular/Lymphatic: Aortic atherosclerosis. No enlarged abdominal or pelvic lymph nodes. Reproductive: Small calcified fundal uterine fibroid. The uterus and ovaries are otherwise unremarkable. Other: There is no pelvic ascites. There is no free hemorrhage, free air or abscess. As above there is only trace perinephric fluid. No space-occupying urinoma. There are no incarcerated hernias. Musculoskeletal: Again noted is advanced degenerative disc collapse at L2-3 and L4-5 with reactive endplate sclerosis, lesser degenerative change elsewhere in the spine. No acute or other significant osseous findings. IMPRESSION: 1. 5 mm left UPJ stone with moderate obstructive uropathy and perinephric stranding. Trace perinephric fluid without focal collection. Please correlate clinically for infectious complication. 2. The distal 7 cm of the left ureter is filled with innumerable tiny stone fragments and sand-like particles up  to the UVJ. 3. Additional multiple multifocal small stones in the left renal collecting system. 4. Aortic and coronary artery atherosclerosis. 5. Diverticulosis without evidence of diverticulitis. 6. Small hiatal hernia. Aortic Atherosclerosis (ICD10-I70.0). Electronically Signed   By: Almira Bar M.D.   On: 02/05/2023 21:07    Procedures Procedures    Medications Ordered in ED Medications  ceFEPIme (MAXIPIME)  2 g in sodium chloride 0.9 % 100 mL IVPB (has no administration in time range)  fentaNYL (SUBLIMAZE) injection 50 mcg (50 mcg Intravenous Given 02/05/23 2047)  ondansetron (ZOFRAN) injection 4 mg (4 mg Intravenous Given 02/05/23 2047)  HYDROmorphone (DILAUDID) injection 1 mg (1 mg Intravenous Given 02/05/23 2113)    ED Course/ Medical Decision Making/ A&P Clinical Course as of 02/05/23 2159  Sat Feb 05, 2023  2040 Stable 19 YOF with flank pain [CC]  2128 Patient's history of present on his physical exam findings are most consistent with obstructive uropathy with likely pyelonephritis.  Initiating antibiotics, consulting urology and arranging for admission with medicine. [CC]    Clinical Course User Index [CC] Glyn Ade, MD                             Medical Decision Making Amount and/or Complexity of Data Reviewed Labs: ordered. Radiology: ordered.  Risk Prescription drug management.   This patient presents to the ED for left-sided flank pain, this involves an extensive number of treatment options, and is a complaint that carries with a high risk of complications and morbidity.  The differential diagnosis includes obstructive stone, infected stone, pyelonephritis, cystitis, perforation, SBO, diverticulitis.  This is not an exhaustive list.  Lab tests: I ordered and personally interpreted labs.  The pertinent results include: WBC 11.4. Hbg unremarkable. Platelets unremarkable. Electrolytes with hypokalemia 2.8. BUN unremarkable, creatinine 1.33.  Urinalysis  positive for leukocytes and Hgb.  Imaging studies: I ordered imaging studies. I personally reviewed, interpreted imaging and agree with the radiologist's interpretations. The results include: CT renal stone showed 1. 5 mm left UPJ stone with moderate obstructive uropathy and perinephric stranding. Trace perinephric fluid without focal collection. Please correlate clinically for infectious complication. 2. The distal 7 cm of the left ureter is filled with innumerable tiny stone fragments and sand-like particles up to the UVJ.   Problem list/ ED course/ Critical interventions/ Medical management: HPI: See above Vital signs within normal range and stable throughout visit. Laboratory/imaging studies significant for: See above. On physical examination, patient is afebrile and appears in no acute distress.  Patient presents with severe left-sided flank pain, nausea and vomiting after lithotripsy yesterday.  CT renal stone showed 5 mm left UPJ stone with moderate obstructive uropathy and perinephric stranding. The distal 7 cm of the left ureter is filled with innumerable tiny stone fragments and sand-like particles up to the UVJ. CBC with white count of 11.4.  CMP with potassium 2.8.  Given Maxipime 2 g IV.  Given Zofran, fentanyl and Dilaudid. Will consult urology and hospitalist. Plan to admit to medicine for IV antibiotics and further evaluation and management.  I have reviewed the patient home medicines and have made adjustments as needed.  Cardiac monitoring/EKG: The patient was maintained on a cardiac monitor.  I personally reviewed and interpreted the cardiac monitor which showed an underlying rhythm of: sinus rhythm.  Additional history obtained: External records from outside source obtained and reviewed including: Chart review including previous notes, labs, imaging.  Consultations obtained: I requested consultation with Dr.Stoneking urology, discussed lab and imaging findings as well as  pertinent plan.  He recommended IV antibiotics and admission to medicine. I requested consultation with Dr. Haroldine Laws, and discussed lab and imaging findings as well as pertinent plan.  She agreed to admit the patient..  Disposition Admit This chart was dictated using voice recognition software.  Despite best efforts to proofread,  errors can occur which can change the documentation meaning.          Final Clinical Impression(s) / ED Diagnoses Final diagnoses:  Kidney stone    Rx / DC Orders ED Discharge Orders     None         Jeanelle Malling, [place] 02/05/23 2204    Glyn Ade, MD 02/06/23 2127

## 2023-02-05 NOTE — ED Notes (Signed)
Patient transported to CT 

## 2023-02-05 NOTE — Consult Note (Signed)
Urology Consult   Physician requesting consult: Glyn Ade, MD  Reason for consult: Left ureteral calculi, flank pain, N/V  History of Present Illness: Melissa Cole is a 72 y.o. female seen in consultation from Dr. Doran Durand for evaluation of left ureteral calculi, left flank pain, and nausea and vomiting.  She underwent left shockwave lithotripsy on 02/04/2023 for a 17 mm calculus at the left UPJ.  Following lithotripsy, she developed worsening left flank pain with associated nausea and vomiting.  She was unable to manage her pain at home with oral pain medication and antiemetics.  She has had worsening left-sided flank pain.  No fevers or chills.  No dysuria or gross hematuria.  She has passed gravel.  She presented to the emergency room this evening due to her worsening symptoms. WBC 11.4 K Cr 1.33 U/A: >50 RBCs, 21-50 WBCs, rare bacteria CT urogram shows moderate hydronephrosis on the left, punctate stones in the left renal collecting system with a 4 mm stone inferiorly and a 5 mm stone at the UPJ, 7 cm of the distal left ureter filled with innumerable stone fragments.  Past Medical History:  Diagnosis Date   Allergy    Basal cell carcinoma    Blood transfusion without reported diagnosis 1969   Cataract    Gallstones    Hepatic cyst    Hypertension    Kidney stones    Mitral valve prolapse    Post-operative nausea and vomiting    Stage 2 chronic kidney disease    Uterine fibroids affecting pregnancy     Past Surgical History:  Procedure Laterality Date   BREAST BIOPSY Left 01/12/2020   CHOLECYSTECTOMY  1998   COLONOSCOPY  12/24/2014   MITRAL VALVE PROLAPS     MOHS SURGERY     basal    POLYPECTOMY     SQUAMOUS CELL CARCINOMA EXCISION      Medications:  Scheduled Meds:  heparin  5,000 Units Subcutaneous Q8H   ketorolac  15 mg Intravenous Once   Continuous Infusions:  ceFEPime (MAXIPIME) IV     [START ON 02/06/2023] cefTRIAXone (ROCEPHIN)  IV     PRN  Meds:.acetaminophen **OR** acetaminophen, morphine injection, ondansetron (ZOFRAN) IV, senna-docusate  Allergies:  Allergies  Allergen Reactions   Amlodipine Cough    Other reaction(s): OTHER Other reaction(s): OTHER    Benazepril Other (See Comments)    Other reaction(s): RASH Other reaction(s): RASH Other reaction(s): RASH    Codeine Other (See Comments)    Stomach pain    Iodine Itching   Mushroom Ext Cmplx(Shiitake-Reishi-Mait) Nausea Only   Other Nausea Only    Paint,  Cig. smoke   Shiitake Mushroom    Latex Rash   Oxycodone-Acetaminophen Nausea Only   Tape Rash    Adhesive Tape    Family History  Problem Relation Age of Onset   Heart attack Mother    Allergies Mother        food sensitivities   Heart attack Maternal Uncle        x 2   Stomach cancer Maternal Grandmother    Diabetes Maternal Aunt        great aunt   Diabetes Paternal Aunt    Colon cancer Neg Hx    Esophageal cancer Neg Hx    Rectal cancer Neg Hx    Colon polyps Neg Hx     Social History:  reports that she has never smoked. She has never used smokeless tobacco. She reports current alcohol use. She  reports that she does not use drugs.  ROS: A complete review of systems was performed.  All systems are negative except for pertinent findings as noted.  Physical Exam:  Vital signs in last 24 hours: Temp:  [98.3 F (36.8 C)] 98.3 F (36.8 C) (05/18 1926) Pulse Rate:  [81-98] 95 (05/18 2130) Resp:  [17-19] 17 (05/18 2130) BP: (141-165)/(64-91) 141/64 (05/18 2130) SpO2:  [94 %-98 %] 94 % (05/18 2130) Weight:  [69.4 kg] 69.4 kg (05/18 1926) GENERAL APPEARANCE:  Well appearing, well developed, well nourished, NAD HEENT:  Atraumatic, normocephalic, oropharynx clear NECK:  Supple without lymphadenopathy  LUNGS:  Clear bilaterally CV:  RRR ABDOMEN:  Soft, non-tender, no masses EXTREMITIES:  Moves all extremities well, without clubbing, cyanosis, or edema NEUROLOGIC:  Alert and oriented x 3,  CN II-XII grossly intact MENTAL STATUS:  appropriate BACK:  Non-tender to palpation, No CVAT SKIN:  Warm, dry, and intact  Laboratory Data:  Recent Labs    02/05/23 2051  WBC 11.4*  HGB 15.1*  HCT 42.4  PLT 218    Recent Labs    02/05/23 2051  NA 132*  K 2.8*  CL 97*  GLUCOSE 108*  BUN 13  CALCIUM 9.4  CREATININE 1.33*     Results for orders placed or performed during the hospital encounter of 02/05/23 (from the past 24 hour(s))  Urinalysis, Routine w reflex microscopic -Urine, Clean Catch     Status: Abnormal   Collection Time: 02/05/23  7:36 PM  Result Value Ref Range   Color, Urine YELLOW YELLOW   APPearance HAZY (A) CLEAR   Specific Gravity, Urine 1.015 1.005 - 1.030   pH 7.0 5.0 - 8.0   Glucose, UA NEGATIVE NEGATIVE mg/dL   Hgb urine dipstick LARGE (A) NEGATIVE   Bilirubin Urine NEGATIVE NEGATIVE   Ketones, ur 20 (A) NEGATIVE mg/dL   Protein, ur 30 (A) NEGATIVE mg/dL   Nitrite NEGATIVE NEGATIVE   Leukocytes,Ua MODERATE (A) NEGATIVE   RBC / HPF >50 0 - 5 RBC/hpf   WBC, UA 21-50 0 - 5 WBC/hpf   Bacteria, UA RARE (A) NONE SEEN   Squamous Epithelial / HPF 0-5 0 - 5 /HPF   Mucus PRESENT    Hyaline Casts, UA PRESENT   CBC with Differential     Status: Abnormal   Collection Time: 02/05/23  8:51 PM  Result Value Ref Range   WBC 11.4 (H) 4.0 - 10.5 K/uL   RBC 4.92 3.87 - 5.11 MIL/uL   Hemoglobin 15.1 (H) 12.0 - 15.0 g/dL   HCT 16.1 09.6 - 04.5 %   MCV 86.2 80.0 - 100.0 fL   MCH 30.7 26.0 - 34.0 pg   MCHC 35.6 30.0 - 36.0 g/dL   RDW 40.9 81.1 - 91.4 %   Platelets 218 150 - 400 K/uL   nRBC 0.0 0.0 - 0.2 %   Neutrophils Relative % 82 %   Neutro Abs 9.4 (H) 1.7 - 7.7 K/uL   Lymphocytes Relative 11 %   Lymphs Abs 1.2 0.7 - 4.0 K/uL   Monocytes Relative 6 %   Monocytes Absolute 0.7 0.1 - 1.0 K/uL   Eosinophils Relative 0 %   Eosinophils Absolute 0.0 0.0 - 0.5 K/uL   Basophils Relative 0 %   Basophils Absolute 0.1 0.0 - 0.1 K/uL   Immature Granulocytes 1 %    Abs Immature Granulocytes 0.06 0.00 - 0.07 K/uL  Comprehensive metabolic panel     Status: Abnormal   Collection  Time: 02/05/23  8:51 PM  Result Value Ref Range   Sodium 132 (L) 135 - 145 mmol/L   Potassium 2.8 (L) 3.5 - 5.1 mmol/L   Chloride 97 (L) 98 - 111 mmol/L   CO2 21 (L) 22 - 32 mmol/L   Glucose, Bld 108 (H) 70 - 99 mg/dL   BUN 13 8 - 23 mg/dL   Creatinine, Ser 7.82 (H) 0.44 - 1.00 mg/dL   Calcium 9.4 8.9 - 95.6 mg/dL   Total Protein 7.5 6.5 - 8.1 g/dL   Albumin 4.2 3.5 - 5.0 g/dL   AST 69 (H) 15 - 41 U/L   ALT 59 (H) 0 - 44 U/L   Alkaline Phosphatase 62 38 - 126 U/L   Total Bilirubin 1.7 (H) 0.3 - 1.2 mg/dL   GFR, Estimated 43 (L) >60 mL/min   Anion gap 14 5 - 15   Recent Results (from the past 240 hour(s))  Urine Culture     Status: None   Collection Time: 01/27/23  3:39 PM   Specimen: Urine, Clean Catch  Result Value Ref Range Status   Specimen Description   Final    URINE, CLEAN CATCH Performed at [facility], [address] [place], [place] [address]    Special Requests   Final    NONE Performed at [facility], [address] [place], [place] [address]    Culture   Final    NO GROWTH Performed at [facility], [address], [place], [place] [address]    Report Status 01/28/2023 FINAL  Final    Renal Function: Recent Labs    02/05/23 2051  CREATININE 1.33*   Estimated Creatinine Clearance: 37.1 mL/min (A) (by C-G formula based on SCr of 1.33 mg/dL (H)).  Radiologic Imaging: DG Abd 1 View  Result Date: 02/05/2023 CLINICAL DATA:  Evaluate left renal stone burden lithotripsy. [id number]. EXAM: ABDOMEN - 1 VIEW COMPARISON:  KUB 01/28/2023 FINDINGS: Again noted is a laminated 1.9 cm left renal pelvis stone. No other nephrolithiasis is seen. There are right upper quadrant cholecystectomy clips. The bowel pattern is normal. There are few pelvic phleboliths. There are no other visible pathologic calcifications or other  significant radiographic findings. Advanced degenerative change lumbar spine. IMPRESSION: 1.9 cm left renal pelvis stone. No other visible pathologic calcifications. Electronically Signed   By: Almira Bar M.D.   On: 02/05/2023 21:10   CT Renal Stone Study  Result Date: 02/05/2023 CLINICAL DATA:  Left flank pain following lithotripsy yesterday. Nausea and unable to keep pain medication down. No report of fever. EXAM: CT ABDOMEN AND PELVIS WITHOUT CONTRAST TECHNIQUE: Multidetector CT imaging of the abdomen and pelvis was performed following the standard protocol without IV contrast. RADIATION DOSE REDUCTION: This exam was performed according to the departmental dose-optimization program which includes automated exposure control, adjustment of the mA and/or kV according to patient size and/or use of iterative reconstruction technique. COMPARISON:  CT without contrast 01/27/2023, CT with contrast 01/24/2014 FINDINGS: Lower chest: There is right lower lobe paraspinal subpleural fibrosis and bronchiolectasis. Scattered linear scar-like opacities in the lung bases and bilateral posterior diaphragmatic fat herniations. Lung bases are clear of infiltrates.  The cardiac size is normal. There is calcification in the right coronary artery. Small hiatal hernia. Hepatobiliary: There is a subcentimeter chronic cyst in the dome of hepatic segment 2. The unenhanced liver is otherwise unremarkable. Gallbladder is absent without biliary dilatation. Pancreas: Unremarkable without contrast. Spleen: Unremarkable  without contrast. Small splenules are noted anterior to the base of the spleen. Adrenals/Urinary Tract: There is no adrenal mass. On the right no stone is seen or hydronephrosis. Bilaterally no contour deforming mass of either kidney. On the left there is increased moderate hydronephrosis and perinephric stranding, trace perinephric fluid without focal collection. There previously was a solitary 1.7 cm UPJ stone.  Currently there are multiple punctate up to 2 mm stones in the left renal collecting system, larger 4 mm stone inferiorly and a 5 mm UPJ stone on 2:36. There is moderate left hydroureter as well with the distal 7 cm of the left ureter filled with innumerable tiny stone fragments and sand like particles up to the UVJ. There are no intravesical stones or bladder thickening. The bladder unremarkable accounting for the degree of distention. Stomach/Bowel: Unremarkable unopacified stomach and small bowel. Normal retrocecal appendix. Scattered colonic diverticulosis without evidence of diverticulitis or colitis. Vascular/Lymphatic: Aortic atherosclerosis. No enlarged abdominal or pelvic lymph nodes. Reproductive: Small calcified fundal uterine fibroid. The uterus and ovaries are otherwise unremarkable. Other: There is no pelvic ascites. There is no free hemorrhage, free air or abscess. As above there is only trace perinephric fluid. No space-occupying urinoma. There are no incarcerated hernias. Musculoskeletal: Again noted is advanced degenerative disc collapse at L2-3 and L4-5 with reactive endplate sclerosis, lesser degenerative change elsewhere in the spine. No acute or other significant osseous findings. IMPRESSION: 1. 5 mm left UPJ stone with moderate obstructive uropathy and perinephric stranding. Trace perinephric fluid without focal collection. Please correlate clinically for infectious complication. 2. The distal 7 cm of the left ureter is filled with innumerable tiny stone fragments and sand-like particles up to the UVJ. 3. Additional multiple multifocal small stones in the left renal collecting system. 4. Aortic and coronary artery atherosclerosis. 5. Diverticulosis without evidence of diverticulitis. 6. Small hiatal hernia. Aortic Atherosclerosis (ICD10-I70.0). Electronically Signed   By: Almira Bar M.D.   On: 02/05/2023 21:07    I independently reviewed the above imaging  studies.  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  The large left renal stone has fragmented well but she has significant fragments in the left distal ureter with obstruction.  Options for management discussed including spontaneous passage and ureteroscopy with laser lithotripsy and stent insertion. Given the amount of steinstrasse, ureteroscopy may be necessary. I also discussed the potential need for nephrostomy tube placement if retrograde access is unable to be obtained.  Risks, benefits, alternatives were discussed with the patient in detail.  Potential risks including, but not limited to, infection; bleeding;  injury to urethra, bladder, or ureter; possible need of other treatments; possible failure to remove the calculus; ureteral  stricture formation; cardiac, pulmonary, cerebrovascular events; and anesthetic complications were discussed.  The patient understands and wishes to proceed.  Agree with admission for IV hydration, pain control, and anti-emetics. Will tentatively plan on ureteroscopy with laser lithotripsy tomorrow.   Di Kindle 02/05/2023, 10:07 PM

## 2023-02-05 NOTE — H&P (Signed)
PCP:   [name], MD   Chief Complaint:  Left flank pain, nausea vomiting  HPI: This is a 72 year old female with past medical history of HTN, HLD, BPH.  Approximately 1 week ago she went to the ER was diagnosed a kidney stone.  She was seen by urologist and arrangements made for outpatient lithotripsy, which was done yesterday.  Per patient, her pain postprocedure was at least twice as much pain compared to before.  She called her urologist, spoke with the urologist.  Arrangements are made for her to be seen in the office however as her pain persistent and increased she came to the ER.  The patient reports left flank pain that radiates to the groin.  She endorses nausea, vomiting and reports normal urine output.  She denies fevers or chills.  In the ER CT abdomen pelvis shows obstructive uropathy secondary to Catheter stones and.  UA positive  Review of Systems:  Per HPI  Past Medical History: Past Medical History:  Diagnosis Date   Allergy    Basal cell carcinoma    Blood transfusion without reported diagnosis 1969   Cataract    Gallstones    Hepatic cyst    Hypertension    Kidney stones    Mitral valve prolapse    Post-operative nausea and vomiting    Stage 2 chronic kidney disease    Uterine fibroids affecting pregnancy    Past Surgical History:  Procedure Laterality Date   BREAST BIOPSY Left 01/12/2020   CHOLECYSTECTOMY  1998   COLONOSCOPY  12/24/2014   MITRAL VALVE PROLAPS     MOHS SURGERY     basal    POLYPECTOMY     SQUAMOUS CELL CARCINOMA EXCISION      Medications: Prior to Admission medications   Medication Sig Start Date End Date Taking? Authorizing Provider  ACTIVELLA 1-0.5 MG per tablet Take 1 tablet by mouth 3 (three) times a week. Once daily 03/18/11   [provider]  Cholecalciferol (VITAMIN D3 PO) Take by mouth.    [provider]  co-enzyme Q-10 30 MG capsule Take 30 mg by mouth 3 (three) times daily.    [provider]   Cyanocobalamin (VITAMIN B-12 PO) Take 3,000 mcg by mouth daily.    [provider]  estradiol (ESTRACE) 0.5 MG tablet Take 0.5 mg by mouth daily.    [provider]  fexofenadine (ALLEGRA) 180 MG tablet Take 180 mg by mouth daily.    [provider]  losartan (COZAAR) 25 MG tablet Take 25 mg by mouth daily.    [provider]  MAGNESIUM-POTASSIUM PO Take by mouth.    [provider]  niacinamide 500 MG tablet Take 500 mg by mouth.    [provider]  nystatin-triamcinolone ointment (MYCOLOG) Apply 1 application topically 2 (two) times daily. 10/23/21   Vivi Barrack, DPM  ondansetron (ZOFRAN-ODT) 4 MG disintegrating tablet Take 1 tablet (4 mg total) by mouth every 8 (eight) hours as needed for nausea or vomiting. 01/27/23   Pricilla Loveless, MD  OVER THE COUNTER MEDICATION Take 2 capsules by mouth daily. Ultimate Oil (fish oil supplement) 1280 mg + CoQQ 10    [provider]  rosuvastatin (CRESTOR) 20 MG tablet Take 20 mg by mouth daily. Not sure about name of medication    [provider]  tamsulosin (FLOMAX) 0.4 MG CAPS capsule Take 1 capsule (0.4 mg total) by mouth daily. 02/04/23   Crist Fat, MD  traMADol (ULTRAM) 50 MG tablet Take 1 tablet (50 mg total) by mouth every 6 (six) hours as needed. 02/04/23 02/04/24  Crist Fat, MD  TraMADol HCl 100 MG CP24 Take by mouth as needed.    [provider]  triamterene-hydrochlorothiazide (MAXZIDE) 75-50 MG per tablet Take 0.5 tablets by mouth daily. Once daily 03/19/11   [provider]    Allergies:   Allergies  Allergen Reactions   Amlodipine Cough    Other reaction(s): OTHER Other reaction(s): OTHER    Benazepril Other (See Comments)    Other reaction(s): RASH Other reaction(s): RASH Other reaction(s): RASH    Codeine Other (See Comments)    Stomach pain    Iodine Itching   Mushroom Ext Cmplx(Shiitake-Reishi-Mait) Nausea Only    Other Nausea Only    Paint,  Cig. smoke   Shiitake Mushroom    Latex Rash   Oxycodone-Acetaminophen Nausea Only   Tape Rash    Adhesive Tape    Social History:  reports that she has never smoked. She has never used smokeless tobacco. She reports current alcohol use. She reports that she does not use drugs.  Family History: Family History  Problem Relation Age of Onset   Heart attack Mother    Allergies Mother        food sensitivities   Heart attack Maternal Uncle        x 2   Stomach cancer Maternal Grandmother    Diabetes Maternal Aunt        great aunt   Diabetes Paternal Aunt    Colon cancer Neg Hx    Esophageal cancer Neg Hx    Rectal cancer Neg Hx    Colon polyps Neg Hx     Physical Exam: Vitals:   02/05/23 1926 02/05/23 1938 02/05/23 2045 02/05/23 2130  BP: (!) 157/91 (!) 151/78 (!) 165/79 (!) 141/64  Pulse: 96 81 98 95  Resp: 18  19 17   Temp: 98.3 F (36.8 C)     TempSrc: Oral     SpO2: 96% 95% 98% 94%  Weight: 69.4 kg     Height: 5\' 4"  (1.626 m)       General:  Alert and oriented times three, well developed and nourished, no acute distress Eyes: PERRLA, pink conjunctiva, no scleral icterus ENT: Moist oral mucosa, neck supple, no thyromegaly Lungs: clear to ascultation, no wheeze, no crackles, no use of accessory muscles Cardiovascular: regular rate and rhythm, no regurgitation, no gallops, no murmurs. No carotid bruits, no JVD Abdomen: soft, positive BS, non-tender, non-distended, no organomegaly, not an acute abdomen.  Left flank tenderness to palpation GU: not examined Neuro: CN II - XII grossly intact Musculoskeletal: strength 5/5 all extremities, no clubbing, cyanosis or edema Skin: no rash, no subcutaneous crepitation, no decubitus Psych: appropriate patient  Labs on Admission:  Recent Labs    02/05/23 2051  NA 132*  K 2.8*  CL 97*  CO2 21*  GLUCOSE 108*  BUN 13  CREATININE 1.33*  CALCIUM 9.4   Recent Labs    02/05/23 2051  AST 69*   ALT 59*  ALKPHOS 62  BILITOT 1.7*  PROT 7.5  ALBUMIN 4.2    Recent Labs    02/05/23 2051  WBC 11.4*  NEUTROABS 9.4*  HGB 15.1*  HCT 42.4  MCV 86.2  PLT 218     Micro Results: Recent Results (from the past 240 hour(s))  Urine Culture     Status: None   Collection Time:  01/27/23  3:39 PM   Specimen: Urine, Clean Catch  Result Value Ref Range Status   Specimen Description   Final    URINE, CLEAN CATCH Performed at [facility], [address] [place], [place] [address]    Special Requests   Final    NONE Performed at [facility], [address] [place], [place] [address]    Culture   Final    NO GROWTH Performed at [facility], [address], [place], [place] [address]    Report Status 01/28/2023 FINAL  Final     Radiological Exams on Admission: DG Abd 1 View  Result Date: 02/05/2023 CLINICAL DATA:  Evaluate left renal stone burden lithotripsy. [id number]. EXAM: ABDOMEN - 1 VIEW COMPARISON:  KUB 01/28/2023 FINDINGS: Again noted is a laminated 1.9 cm left renal pelvis stone. No other nephrolithiasis is seen. There are right upper quadrant cholecystectomy clips. The bowel pattern is normal. There are few pelvic phleboliths. There are no other visible pathologic calcifications or other significant radiographic findings. Advanced degenerative change lumbar spine. IMPRESSION: 1.9 cm left renal pelvis stone. No other visible pathologic calcifications. Electronically Signed   By: Almira Bar M.D.   On: 02/05/2023 21:10   CT Renal Stone Study  Result Date: 02/05/2023 CLINICAL DATA:  Left flank pain following lithotripsy yesterday. Nausea and unable to keep pain medication down. No report of fever. EXAM: CT ABDOMEN AND PELVIS WITHOUT CONTRAST TECHNIQUE: Multidetector CT imaging of the abdomen and pelvis was performed following the standard protocol without IV contrast. RADIATION DOSE REDUCTION: This exam was performed according to the  departmental dose-optimization program which includes automated exposure control, adjustment of the mA and/or kV according to patient size and/or use of iterative reconstruction technique. COMPARISON:  CT without contrast 01/27/2023, CT with contrast 01/24/2014 FINDINGS: Lower chest: There is right lower lobe paraspinal subpleural fibrosis and bronchiolectasis. Scattered linear scar-like opacities in the lung bases and bilateral posterior diaphragmatic fat herniations. Lung bases are clear of infiltrates.  The cardiac size is normal. There is calcification in the right coronary artery. Small hiatal hernia. Hepatobiliary: There is a subcentimeter chronic cyst in the dome of hepatic segment 2. The unenhanced liver is otherwise unremarkable. Gallbladder is absent without biliary dilatation. Pancreas: Unremarkable without contrast. Spleen: Unremarkable without contrast. Small splenules are noted anterior to the base of the spleen. Adrenals/Urinary Tract: There is no adrenal mass. On the right no stone is seen or hydronephrosis. Bilaterally no contour deforming mass of either kidney. On the left there is increased moderate hydronephrosis and perinephric stranding, trace perinephric fluid without focal collection. There previously was a solitary 1.7 cm UPJ stone. Currently there are multiple punctate up to 2 mm stones in the left renal collecting system, larger 4 mm stone inferiorly and a 5 mm UPJ stone on 2:36. There is moderate left hydroureter as well with the distal 7 cm of the left ureter filled with innumerable tiny stone fragments and sand like particles up to the UVJ. There are no intravesical stones or bladder thickening. The bladder unremarkable accounting for the degree of distention. Stomach/Bowel: Unremarkable unopacified stomach and small bowel. Normal retrocecal appendix. Scattered colonic diverticulosis without evidence of diverticulitis or colitis. Vascular/Lymphatic: Aortic atherosclerosis. No enlarged  abdominal or pelvic lymph nodes. Reproductive: Small calcified fundal uterine fibroid. The uterus and ovaries are otherwise unremarkable. Other: There is no pelvic ascites. There is no free hemorrhage, free air or abscess. As above there is only  trace perinephric fluid. No space-occupying urinoma. There are no incarcerated hernias. Musculoskeletal: Again noted is advanced degenerative disc collapse at L2-3 and L4-5 with reactive endplate sclerosis, lesser degenerative change elsewhere in the spine. No acute or other significant osseous findings. IMPRESSION: 1. 5 mm left UPJ stone with moderate obstructive uropathy and perinephric stranding. Trace perinephric fluid without focal collection. Please correlate clinically for infectious complication. 2. The distal 7 cm of the left ureter is filled with innumerable tiny stone fragments and sand-like particles up to the UVJ. 3. Additional multiple multifocal small stones in the left renal collecting system. 4. Aortic and coronary artery atherosclerosis. 5. Diverticulosis without evidence of diverticulitis. 6. Small hiatal hernia. Aortic Atherosclerosis (ICD10-I70.0). Electronically Signed   By: Almira Bar M.D.   On: 02/05/2023 21:07    Assessment/Plan Present on Admission:  Acute cystitis, possible pyeloright-sided -Urology consult placed, Dr. Pete Glatter has seen patient -Urine and blood cultures collected -IV Rocephin daily ordered -Pain meds as needed -Strict I's and O's  Obstructive uropathy/nephrolithiasis -Management per urology  AKI -IV fluid hydration -Strict I's and O's -BMP in a.m.  Hypokalemia -Repleting via IV fluid -Secondary to nausea and vomiting -BMP in a.m. -Magnesium level added  Transaminitis -Likely secondary to above.  CMP in a.m., monitor -Imaging if LFTs continue to rise  HLD -Crestor 5 mg p.o. daily resumed  Hypertension -Cozaar resumed   Gayle Martinez 02/05/2023, 9:59 PM

## 2023-02-06 ENCOUNTER — Inpatient Hospital Stay (HOSPITAL_COMMUNITY): Payer: Medicare PPO

## 2023-02-06 ENCOUNTER — Inpatient Hospital Stay (HOSPITAL_BASED_OUTPATIENT_CLINIC_OR_DEPARTMENT_OTHER): Payer: Medicare PPO | Admitting: Anesthesiology

## 2023-02-06 ENCOUNTER — Encounter (HOSPITAL_COMMUNITY): Payer: Self-pay | Admitting: Family Medicine

## 2023-02-06 ENCOUNTER — Inpatient Hospital Stay (HOSPITAL_COMMUNITY): Payer: Medicare PPO | Admitting: Anesthesiology

## 2023-02-06 ENCOUNTER — Encounter (HOSPITAL_COMMUNITY)
Admission: EM | Disposition: A | Discharge status: Home / Self Care | Payer: Self-pay | Source: Home / Self Care | Attending: Emergency Medicine

## 2023-02-06 DIAGNOSIS — N200 Calculus of kidney: Secondary | ICD-10-CM

## 2023-02-06 DIAGNOSIS — I10 Essential (primary) hypertension: Secondary | ICD-10-CM

## 2023-02-06 DIAGNOSIS — N179 Acute kidney failure, unspecified: Secondary | ICD-10-CM

## 2023-02-06 DIAGNOSIS — N139 Obstructive and reflux uropathy, unspecified: Secondary | ICD-10-CM

## 2023-02-06 DIAGNOSIS — N201 Calculus of ureter: Secondary | ICD-10-CM

## 2023-02-06 HISTORY — PX: CYSTOSCOPY W/ URETERAL STENT PLACEMENT: SHX1429

## 2023-02-06 HISTORY — PX: URETEROSCOPY WITH HOLMIUM LASER LITHOTRIPSY: SHX6645

## 2023-02-06 LAB — CBC WITH DIFFERENTIAL/PLATELET
Abs Immature Granulocytes: 0.04 10*3/uL (ref 0.00–0.07)
Basophils Absolute: 0 10*3/uL (ref 0.0–0.1)
Basophils Relative: 0 %
Eosinophils Absolute: 0 10*3/uL (ref 0.0–0.5)
Eosinophils Relative: 0 %
HCT: 37.2 % (ref 36.0–46.0)
Hemoglobin: 12.9 g/dL (ref 12.0–15.0)
Immature Granulocytes: 1 %
Lymphocytes Relative: 17 %
Lymphs Abs: 1.4 10*3/uL (ref 0.7–4.0)
MCH: 30.4 pg (ref 26.0–34.0)
MCHC: 34.7 g/dL (ref 30.0–36.0)
MCV: 87.5 fL (ref 80.0–100.0)
Monocytes Absolute: 0.6 10*3/uL (ref 0.1–1.0)
Monocytes Relative: 7 %
Neutro Abs: 6.2 10*3/uL (ref 1.7–7.7)
Neutrophils Relative %: 75 %
Platelets: 182 10*3/uL (ref 150–400)
RBC: 4.25 MIL/uL (ref 3.87–5.11)
RDW: 13.2 % (ref 11.5–15.5)
WBC: 8.2 10*3/uL (ref 4.0–10.5)
nRBC: 0 % (ref 0.0–0.2)

## 2023-02-06 LAB — COMPREHENSIVE METABOLIC PANEL
ALT: 58 U/L — ABNORMAL HIGH (ref 0–44)
AST: 62 U/L — ABNORMAL HIGH (ref 15–41)
Albumin: 3.3 g/dL — ABNORMAL LOW (ref 3.5–5.0)
Alkaline Phosphatase: 54 U/L (ref 38–126)
Anion gap: 9 (ref 5–15)
BUN: 14 mg/dL (ref 8–23)
CO2: 22 mmol/L (ref 22–32)
Calcium: 8.3 mg/dL — ABNORMAL LOW (ref 8.9–10.3)
Chloride: 101 mmol/L (ref 98–111)
Creatinine, Ser: 1.32 mg/dL — ABNORMAL HIGH (ref 0.44–1.00)
GFR, Estimated: 43 mL/min — ABNORMAL LOW (ref >60)
Glucose, Bld: 92 mg/dL (ref 70–99)
Potassium: 3.5 mmol/L (ref 3.5–5.1)
Sodium: 132 mmol/L — ABNORMAL LOW (ref 135–145)
Total Bilirubin: 1.5 mg/dL — ABNORMAL HIGH (ref 0.3–1.2)
Total Protein: 6 g/dL — ABNORMAL LOW (ref 6.5–8.1)

## 2023-02-06 LAB — CULTURE, BLOOD (ROUTINE X 2)

## 2023-02-06 LAB — LACTIC ACID, PLASMA: Lactic Acid, Venous: 0.7 mmol/L (ref 0.5–1.9)

## 2023-02-06 SURGERY — CYSTOSCOPY, WITH RETROGRADE PYELOGRAM AND URETERAL STENT INSERTION
Anesthesia: General | Site: Ureter | Laterality: Left

## 2023-02-06 MED ORDER — FENTANYL CITRATE (PF) 100 MCG/2ML IJ SOLN
INTRAMUSCULAR | Status: DC | PRN
  Administered 2023-02-06: 50 ug via INTRAVENOUS

## 2023-02-06 MED ORDER — LACTATED RINGERS IV SOLN: INTRAVENOUS | Status: DC | PRN

## 2023-02-06 MED ORDER — PROPOFOL 10 MG/ML IV BOLUS
INTRAVENOUS | Status: DC | PRN
  Administered 2023-02-06: 160 mg via INTRAVENOUS

## 2023-02-06 MED ORDER — LIDOCAINE HCL 2 % EX GEL
CUTANEOUS | Status: DC | PRN
  Administered 2023-02-06: 1 via URETHRAL

## 2023-02-06 MED ORDER — AMISULPRIDE (ANTIEMETIC) 5 MG/2ML IV SOLN: 10.0000 mg | Freq: Once | INTRAVENOUS | Status: DC | PRN

## 2023-02-06 MED ORDER — LIDOCAINE 2% (20 MG/ML) 5 ML SYRINGE
INTRAMUSCULAR | Status: DC | PRN
  Administered 2023-02-06: 80 mg via INTRAVENOUS

## 2023-02-06 MED ORDER — DEXAMETHASONE SODIUM PHOSPHATE 10 MG/ML IJ SOLN
INTRAMUSCULAR | Status: DC | PRN
  Administered 2023-02-06: 10 mg via INTRAVENOUS

## 2023-02-06 MED ORDER — IOHEXOL 300 MG/ML  SOLN
INTRAMUSCULAR | Status: DC | PRN
  Administered 2023-02-06: 20 mL

## 2023-02-06 MED ORDER — FENTANYL CITRATE PF 50 MCG/ML IJ SOSY: 25.0000 ug | PREFILLED_SYRINGE | INTRAMUSCULAR | Status: DC | PRN

## 2023-02-06 MED ORDER — ONDANSETRON HCL 4 MG/2ML IJ SOLN
INTRAMUSCULAR | Status: DC | PRN
  Administered 2023-02-06: 4 mg via INTRAVENOUS

## 2023-02-06 MED ORDER — FENTANYL CITRATE (PF) 100 MCG/2ML IJ SOLN
INTRAMUSCULAR | Status: AC
  Filled 2023-02-06: qty 2

## 2023-02-06 MED ORDER — LIDOCAINE HCL URETHRAL/MUCOSAL 2 % EX GEL
CUTANEOUS | Status: AC
  Filled 2023-02-06: qty 30

## 2023-02-06 MED ORDER — SODIUM CHLORIDE 0.9 % IR SOLN
Status: DC | PRN
  Administered 2023-02-06: 6000 mL via INTRAVESICAL

## 2023-02-06 SURGICAL SUPPLY — 26 items
BAG COUNTER SPONGE SURGICOUNT (BAG) IMPLANT
BAG SPNG CNTER NS LX DISP (BAG)
BAG URO CATCHER STRL LF (MISCELLANEOUS) ×1 IMPLANT
BASKET ZERO TIP NITINOL 2.4FR (BASKET) IMPLANT
BSKT STON RTRVL ZERO TP 2.4FR (BASKET)
CATH URETL OPEN 5X70 (CATHETERS) IMPLANT
CLOTH BEACON ORANGE TIMEOUT ST (SAFETY) ×1 IMPLANT
EXTRACTOR STONE NITINOL NGAGE (UROLOGICAL SUPPLIES) IMPLANT
GLOVE SS BIOGEL STRL SZ 8 (GLOVE) ×1 IMPLANT
GOWN STRL REUS W/ TWL XL LVL3 (GOWN DISPOSABLE) ×1 IMPLANT
GOWN STRL REUS W/TWL XL LVL3 (GOWN DISPOSABLE) ×1
GUIDEWIRE STR DUAL SENSOR (WIRE) ×1 IMPLANT
GUIDEWIRE ZIPWRE .038 STRAIGHT (WIRE) IMPLANT
IV NS 1000ML (IV SOLUTION) ×1
IV NS 1000ML BAXH (IV SOLUTION) ×1 IMPLANT
KIT TURNOVER KIT A (KITS) IMPLANT
LASER FIB FLEXIVA PULSE ID 365 (Laser) IMPLANT
MANIFOLD NEPTUNE II (INSTRUMENTS) ×1 IMPLANT
PACK CYSTO (CUSTOM PROCEDURE TRAY) ×1 IMPLANT
SHEATH NAVIGATOR HD 11/13X28 (SHEATH) IMPLANT
SHEATH NAVIGATOR HD 12/14X36 (SHEATH) IMPLANT
STENT URET 6FRX26 CONTOUR (STENTS) IMPLANT
TRACTIP FLEXIVA PULS ID 200XHI (Laser) IMPLANT
TRACTIP FLEXIVA PULSE ID 200 (Laser) ×1
TUBING CONNECTING 10 (TUBING) ×1 IMPLANT
TUBING UROLOGY SET (TUBING) ×1 IMPLANT

## 2023-02-06 NOTE — Anesthesia Preprocedure Evaluation (Signed)
Anesthesia Evaluation  Patient identified by MRN, date of birth, ID band Patient awake    Reviewed: Allergy & Precautions, NPO status , Patient's Chart, lab work & pertinent test results  Airway Mallampati: II  TM Distance: >3 FB Neck ROM: Full    Dental  (+) Dental Advisory Given   Pulmonary neg pulmonary ROS   breath sounds clear to auscultation       Cardiovascular hypertension, Pt. on medications  Rhythm:Regular Rate:Normal     Neuro/Psych negative neurological ROS     GI/Hepatic negative GI ROS, Neg liver ROS,,,  Endo/Other  negative endocrine ROS    Renal/GU ARFRenal disease     Musculoskeletal   Abdominal   Peds  Hematology negative hematology ROS (+)   Anesthesia Other Findings   Reproductive/Obstetrics                             Anesthesia Physical Anesthesia Plan  ASA: 2  Anesthesia Plan: General   Post-op Pain Management: Ofirmev IV (intra-op)*   Induction: Intravenous  PONV Risk Score and Plan: 3 and Dexamethasone, Ondansetron and Treatment may vary due to age or medical condition  Airway Management Planned: LMA  Additional Equipment:   Intra-op Plan:   Post-operative Plan: Extubation in OR  Informed Consent: I have reviewed the patients History and Physical, chart, labs and discussed the procedure including the risks, benefits and alternatives for the proposed anesthesia with the patient or authorized representative who has indicated his/her understanding and acceptance.     Dental advisory given  Plan Discussed with: CRNA  Anesthesia Plan Comments:        Anesthesia Quick Evaluation

## 2023-02-06 NOTE — Transfer of Care (Signed)
Immediate Anesthesia Transfer of Care Note  Patient: Melissa Cole  Procedure(s) Performed: CYSTOSCOPY WITH RETROGRADE PYELOGRAM/URETERAL STENT PLACEMENT (Left: Ureter) URETEROSCOPY WITH HOLMIUM LASER LITHOTRIPSY (Left: Ureter)  Patient Location: PACU  Anesthesia Type:General  Level of Consciousness: awake, alert , and oriented  Airway & Oxygen Therapy: Patient Spontanous Breathing and Patient connected to face mask oxygen  Post-op Assessment: Report given to RN and Post -op Vital signs reviewed and stable  Post vital signs: Reviewed and stable  Last Vitals:  Vitals Value Taken Time  BP 132/63 02/06/23 1009  Temp    Pulse 102 02/06/23 1010  Resp 16 02/06/23 1010  SpO2 100 % 02/06/23 1010  Vitals shown include unvalidated device data.  Last Pain:  Vitals:   02/06/23 0810  TempSrc:   PainSc: 0-No pain         Complications: No notable events documented.

## 2023-02-06 NOTE — Anesthesia Postprocedure Evaluation (Signed)
Anesthesia Post Note  Patient: Melissa Cole  Procedure(s) Performed: CYSTOSCOPY WITH RETROGRADE PYELOGRAM/URETERAL STENT PLACEMENT (Left: Ureter) URETEROSCOPY WITH HOLMIUM LASER LITHOTRIPSY (Left: Ureter)     Patient location during evaluation: PACU Anesthesia Type: General Level of consciousness: awake and alert Pain management: pain level controlled Vital Signs Assessment: post-procedure vital signs reviewed and stable Respiratory status: spontaneous breathing, nonlabored ventilation, respiratory function stable and patient connected to nasal cannula oxygen Cardiovascular status: blood pressure returned to baseline and stable Postop Assessment: no apparent nausea or vomiting Anesthetic complications: no  No notable events documented.  Last Vitals:  Vitals:   02/06/23 1036 02/06/23 1050  BP:  128/63  Pulse: 90 88  Resp: 20 16  Temp:  36.8 C  SpO2: 98% 97%    Last Pain:  Vitals:   02/06/23 1050  TempSrc: Oral  PainSc:                  Kennieth Rad

## 2023-02-06 NOTE — Progress Notes (Signed)
  Transition of Care [facility]) Screening Note   Patient Details  Name: LUUL TORELLI Date of Birth: 1951-07-28   Transition of [facility]) CM/SW Contact:    Adrian Prows, RN Phone Number: 02/06/2023, 3:28 PM    Transition of Care Department [facility]) has reviewed patient and no TOC needs have been identified at this time. We will continue to monitor patient advancement through interdisciplinary progression rounds. If new patient transition needs arise, please place a TOC consult.

## 2023-02-06 NOTE — Progress Notes (Signed)
Urology Inpatient Progress Note  Subjective: No acute events overnight.  Pain controlled.  No N/V.  No fever or chills. She has not passed any stone fragments.  Anti-infectives: Anti-infectives (From admission, onward)    Start     Dose/Rate Route Frequency Ordered Stop   02/06/23 2100  cefTRIAXone (ROCEPHIN) 2 g in sodium chloride 0.9 % 100 mL IVPB        2 g 200 mL/hr over 30 Minutes Intravenous Every 24 hours 02/05/23 2205     02/05/23 2145  ceFEPIme (MAXIPIME) 2 g in sodium chloride 0.9 % 100 mL IVPB        2 g 200 mL/hr over 30 Minutes Intravenous  Once 02/05/23 2135 02/05/23 2239       Current Facility-Administered Medications  Medication Dose Route Frequency Provider Last Rate Last Admin   0.9 % NaCl with KCl 20 mEq/ L  infusion   Intravenous Continuous Gery Pray, MD 75 mL/hr at 02/06/23 0010 New Bag at 02/06/23 0010   acetaminophen (TYLENOL) tablet 650 mg  650 mg Oral Q6H PRN Gery Pray, MD       Or   acetaminophen (TYLENOL) suppository 650 mg  650 mg Rectal Q6H PRN Crosley, Debby, MD       cefTRIAXone (ROCEPHIN) 2 g in sodium chloride 0.9 % 100 mL IVPB  2 g Intravenous Q24H Crosley, Debby, MD       heparin injection 5,000 Units  5,000 Units Subcutaneous Q8H Crosley, Debby, MD   5,000 Units at 02/05/23 2213   losartan (COZAAR) tablet 25 mg  25 mg Oral Daily Crosley, Debby, MD       morphine (PF) 2 MG/ML injection 2 mg  2 mg Intravenous Q4H PRN Crosley, Debby, MD       ondansetron (ZOFRAN) injection 4 mg  4 mg Intravenous Q6H PRN Crosley, Debby, MD       rosuvastatin (CRESTOR) tablet 5 mg  5 mg Oral Daily Crosley, Debby, MD       senna-docusate (Senokot-S) tablet 1 tablet  1 tablet Oral QHS PRN Joneen Roach, Debby, MD         Objective: Vital signs in last 24 hours: Temp:  [98 F (36.7 C)-98.3 F (36.8 C)] 98 F (36.7 C) (05/19 0408) Pulse Rate:  [81-99] 83 (05/19 0408) Resp:  [16-19] 16 (05/19 0408) BP: (111-165)/(59-91) 111/59 (05/19 0408) SpO2:  [93 %-98 %]  98 % (05/19 0408) Weight:  [69.4 kg] 69.4 kg (05/18 1926)  Intake/Output from previous day: 05/18 0701 - 05/19 0700 In: 96.5 [IV Piggyback:96.5] Out: -  Intake/Output this shift: Total I/O In: 96.5 [IV Piggyback:96.5] Out: -   GENERAL APPEARANCE:  Well appearing, well developed, well nourished, NAD HEENT:  Atraumatic, normocephalic, oropharynx clear NECK:  Supple without lymphadenopathy  ABDOMEN:  Soft, non-tender, no masses EXTREMITIES:  Moves all extremities well, without clubbing, cyanosis, or edema NEUROLOGIC:  Alert and oriented x 3, CN II-XII grossly intact MENTAL STATUS:  appropriate BACK:  Non-tender to palpation, No CVAT SKIN:  Warm, dry, and intact   Lab Results:  Recent Labs    02/05/23 2051 02/06/23 0458  WBC 11.4* 8.2  HGB 15.1* 12.9  HCT 42.4 37.2  PLT 218 182   BMET Recent Labs    02/05/23 2051 02/06/23 0458  NA 132* 132*  K 2.8* 3.5  CL 97* 101  CO2 21* 22  GLUCOSE 108* 92  BUN 13 14  CREATININE 1.33* 1.32*  CALCIUM 9.4 8.3*   PT/INR No results  for input(s): "LABPROT", "INR" in the last 72 hours. ABG No results for input(s): "PHART", "HCO3" in the last 72 hours.  Invalid input(s): "PCO2", "PO2"  Studies/Results: DG Abd 1 View  Result Date: 02/05/2023 CLINICAL DATA:  Evaluate left renal stone burden lithotripsy. [id number]. EXAM: ABDOMEN - 1 VIEW COMPARISON:  KUB 01/28/2023 FINDINGS: Again noted is a laminated 1.9 cm left renal pelvis stone. No other nephrolithiasis is seen. There are right upper quadrant cholecystectomy clips. The bowel pattern is normal. There are few pelvic phleboliths. There are no other visible pathologic calcifications or other significant radiographic findings. Advanced degenerative change lumbar spine. IMPRESSION: 1.9 cm left renal pelvis stone. No other visible pathologic calcifications. Electronically Signed   By: Almira Bar M.D.   On: 02/05/2023 21:10   CT Renal Stone Study  Result Date: 02/05/2023 CLINICAL DATA:   Left flank pain following lithotripsy yesterday. Nausea and unable to keep pain medication down. No report of fever. EXAM: CT ABDOMEN AND PELVIS WITHOUT CONTRAST TECHNIQUE: Multidetector CT imaging of the abdomen and pelvis was performed following the standard protocol without IV contrast. RADIATION DOSE REDUCTION: This exam was performed according to the departmental dose-optimization program which includes automated exposure control, adjustment of the mA and/or kV according to patient size and/or use of iterative reconstruction technique. COMPARISON:  CT without contrast 01/27/2023, CT with contrast 01/24/2014 FINDINGS: Lower chest: There is right lower lobe paraspinal subpleural fibrosis and bronchiolectasis. Scattered linear scar-like opacities in the lung bases and bilateral posterior diaphragmatic fat herniations. Lung bases are clear of infiltrates.  The cardiac size is normal. There is calcification in the right coronary artery. Small hiatal hernia. Hepatobiliary: There is a subcentimeter chronic cyst in the dome of hepatic segment 2. The unenhanced liver is otherwise unremarkable. Gallbladder is absent without biliary dilatation. Pancreas: Unremarkable without contrast. Spleen: Unremarkable without contrast. Small splenules are noted anterior to the base of the spleen. Adrenals/Urinary Tract: There is no adrenal mass. On the right no stone is seen or hydronephrosis. Bilaterally no contour deforming mass of either kidney. On the left there is increased moderate hydronephrosis and perinephric stranding, trace perinephric fluid without focal collection. There previously was a solitary 1.7 cm UPJ stone. Currently there are multiple punctate up to 2 mm stones in the left renal collecting system, larger 4 mm stone inferiorly and a 5 mm UPJ stone on 2:36. There is moderate left hydroureter as well with the distal 7 cm of the left ureter filled with innumerable tiny stone fragments and sand like particles up to  the UVJ. There are no intravesical stones or bladder thickening. The bladder unremarkable accounting for the degree of distention. Stomach/Bowel: Unremarkable unopacified stomach and small bowel. Normal retrocecal appendix. Scattered colonic diverticulosis without evidence of diverticulitis or colitis. Vascular/Lymphatic: Aortic atherosclerosis. No enlarged abdominal or pelvic lymph nodes. Reproductive: Small calcified fundal uterine fibroid. The uterus and ovaries are otherwise unremarkable. Other: There is no pelvic ascites. There is no free hemorrhage, free air or abscess. As above there is only trace perinephric fluid. No space-occupying urinoma. There are no incarcerated hernias. Musculoskeletal: Again noted is advanced degenerative disc collapse at L2-3 and L4-5 with reactive endplate sclerosis, lesser degenerative change elsewhere in the spine. No acute or other significant osseous findings. IMPRESSION: 1. 5 mm left UPJ stone with moderate obstructive uropathy and perinephric stranding. Trace perinephric fluid without focal collection. Please correlate clinically for infectious complication. 2. The distal 7 cm of the left ureter is filled with innumerable tiny stone fragments  and sand-like particles up to the UVJ. 3. Additional multiple multifocal small stones in the left renal collecting system. 4. Aortic and coronary artery atherosclerosis. 5. Diverticulosis without evidence of diverticulitis. 6. Small hiatal hernia. Aortic Atherosclerosis (ICD10-I70.0). Electronically Signed   By: Almira Bar M.D.   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 Options for management of obstructing distal stone fragments discussed including spontaneous passage and ureteroscopy with laser lithotripsy and stent insertion. She would like to proceed with cystoscopy, left retrograde pyelogram,  left ureteroscopy with laser lithotripsy, and insertion of left ureteral stent.  I again discussed the potential need for nephrostomy tube placement if retrograde access is unable to be obtained.   Risks, benefits, alternatives were discussed with the patient in detail.  Potential risks including, but not limited to, infection; bleeding;  injury to urethra, bladder, or ureter; possible need of other treatments; possible failure to remove the calculus; ureteral  stricture formation; cardiac, pulmonary, cerebrovascular events; and anesthetic complications were discussed.  The patient understands and wishes to proceed.   Di Kindle, MD 02/06/2023

## 2023-02-06 NOTE — Care Management CC44 (Signed)
Condition Code 44 Documentation Completed  Patient Details  Name: Melissa Cole MRN: [id number] Date of Birth: 1951/01/20   Condition Code 44 given:  Yes Patient signature on Condition Code 44 notice:  Yes Documentation of 2 MD's agreement:    Code 44 added to claim:  Yes    Adrian Prows, RN 02/06/2023, 3:18 PM

## 2023-02-06 NOTE — Plan of Care (Signed)

## 2023-02-06 NOTE — Discharge Summary (Signed)
Physician Discharge Summary  Melissa Cole:[id number] DOB: 31-Mar-1951 DOA: 02/05/2023  PCP: [name], MD  Admit date: 02/05/2023 Discharge date: 02/06/2023  Admitted From: Home Disposition: Home  Recommendations for Outpatient Follow-up:  Follow up with PCP in 1-2 weeks Follow-up urology as scheduled in 1 week for stent removal:  Home Health: None Equipment/Devices: None  Discharge Condition: Stable CODE STATUS: Full Diet recommendation: Low-salt low-fat diet  Brief/Interim Summary: This is a 72 year old female with past medical history of HTN, HLD, BPH.  Recently diagnosed with a kidney stone, outpatient lithotripsy was completed 02/04/2023 without event.  Post procedurally patient's pain continued to worsen, spoke with her urology team with outpatient follow-up planned this week but due to worsening pain reported to the ED in the interim.  Patient was evaluated by urology here with left ureteral calculi with Steinstrasse status post left ESL.  Pain appears to be well-controlled at this time, tolerating p.o., per discussion with urology she is otherwise stable and agreeable for discharge home.  Outpatient follow-up with PCP as scheduled, follow-up with urology for stent removal in 1 week.  Discharge Diagnoses:  Principal Problem:   Obstructive uropathy Active Problems:   Hypertension   Acute cystitis   Nephrolithiasis   Hypokalemia   AKI (acute kidney injury) (HCC)   Transaminitis   HLD (hyperlipidemia)   BPH (benign prostatic hyperplasia)   Ureteral calculus, left   Flank pain   Nausea and vomiting   Discharge Instructions   Allergies as of 02/06/2023       Reactions   Amlodipine Cough   Other reaction(s): OTHER Other reaction(s): OTHER   Benazepril Other (See Comments)   Other reaction(s): RASH Other reaction(s): RASH Other reaction(s): RASH   Codeine Other (See Comments)   Stomach pain   Iodine Itching   Mushroom Ext Cmplx(shiitake-reishi-mait) Nausea  Only   Other Nausea Only   Paint,  Cig. smoke   Shiitake Mushroom    Latex Rash   Oxycodone-acetaminophen Nausea Only   Tape Rash   Adhesive Tape        Medication List     STOP taking these medications    nystatin-triamcinolone ointment Commonly known as: MYCOLOG       TAKE these medications    Activella 1-0.5 MG tablet Generic drug: estradiol-norethindrone Take 1 tablet by mouth daily. Once daily   co-enzyme Q-10 30 MG capsule Take 30 mg by mouth 3 (three) times daily.   fexofenadine 180 MG tablet Commonly known as: ALLEGRA Take 180 mg by mouth daily.   HYDROcodone-acetaminophen 5-325 MG tablet Commonly known as: NORCO/VICODIN Take 1 tablet by mouth every 6 (six) hours as needed for moderate pain.   losartan 25 MG tablet Commonly known as: COZAAR Take 25 mg by mouth daily.   ondansetron 4 MG disintegrating tablet Commonly known as: ZOFRAN-ODT Take 1 tablet (4 mg total) by mouth every 8 (eight) hours as needed for nausea or vomiting.   OVER THE COUNTER MEDICATION Take 2 capsules by mouth daily. Ultimate Oil (fish oil supplement) 1280 mg + CoQQ 10   rosuvastatin 5 MG tablet Commonly known as: CRESTOR Take 5 mg by mouth daily. What changed: Another medication with the same name was removed. Continue taking this medication, and follow the directions you see here.   tamsulosin 0.4 MG Caps capsule Commonly known as: FLOMAX Take 1 capsule (0.4 mg total) by mouth daily.   traMADol 50 MG tablet Commonly known as: Ultram Take 1 tablet (50 mg total) by mouth  every 6 (six) hours as needed. What changed: reasons to take this   triamterene-hydrochlorothiazide 75-50 MG tablet Commonly known as: MAXZIDE Take 1 tablet by mouth daily. Once daily   VITAMIN B-12 PO Take 3,000 mcg by mouth every other day.   VITAMIN D3 PO Take 1 tablet by mouth every other day.        Follow-up Information     Crist Fat, MD. Call in 1 week(s).   Specialty:  Urology Contact information: [address] [place] [address] [phone]                Allergies  Allergen Reactions   Amlodipine Cough    Other reaction(s): OTHER Other reaction(s): OTHER    Benazepril Other (See Comments)    Other reaction(s): RASH Other reaction(s): RASH Other reaction(s): RASH    Codeine Other (See Comments)    Stomach pain    Iodine Itching   Mushroom Ext Cmplx(Shiitake-Reishi-Mait) Nausea Only   Other Nausea Only    Paint,  Cig. smoke   Shiitake Mushroom    Latex Rash   Oxycodone-Acetaminophen Nausea Only   Tape Rash    Adhesive Tape    Consultations: Urology  Procedures/Studies: DG C-Arm 1-60 Min-No Report  Result Date: 02/06/2023 Fluoroscopy was utilized by the requesting physician.  No radiographic interpretation.   DG Abd 1 View  Result Date: 02/05/2023 CLINICAL DATA:  Evaluate left renal stone burden lithotripsy. [id number]. EXAM: ABDOMEN - 1 VIEW COMPARISON:  KUB 01/28/2023 FINDINGS: Again noted is a laminated 1.9 cm left renal pelvis stone. No other nephrolithiasis is seen. There are right upper quadrant cholecystectomy clips. The bowel pattern is normal. There are few pelvic phleboliths. There are no other visible pathologic calcifications or other significant radiographic findings. Advanced degenerative change lumbar spine. IMPRESSION: 1.9 cm left renal pelvis stone. No other visible pathologic calcifications. Electronically Signed   By: Almira Bar M.D.   On: 02/05/2023 21:10   CT Renal Stone Study  Result Date: 02/05/2023 CLINICAL DATA:  Left flank pain following lithotripsy yesterday. Nausea and unable to keep pain medication down. No report of fever. EXAM: CT ABDOMEN AND PELVIS WITHOUT CONTRAST TECHNIQUE: Multidetector CT imaging of the abdomen and pelvis was performed following the standard protocol without IV contrast. RADIATION DOSE REDUCTION: This exam was performed according to the departmental dose-optimization  program which includes automated exposure control, adjustment of the mA and/or kV according to patient size and/or use of iterative reconstruction technique. COMPARISON:  CT without contrast 01/27/2023, CT with contrast 01/24/2014 FINDINGS: Lower chest: There is right lower lobe paraspinal subpleural fibrosis and bronchiolectasis. Scattered linear scar-like opacities in the lung bases and bilateral posterior diaphragmatic fat herniations. Lung bases are clear of infiltrates.  The cardiac size is normal. There is calcification in the right coronary artery. Small hiatal hernia. Hepatobiliary: There is a subcentimeter chronic cyst in the dome of hepatic segment 2. The unenhanced liver is otherwise unremarkable. Gallbladder is absent without biliary dilatation. Pancreas: Unremarkable without contrast. Spleen: Unremarkable without contrast. Small splenules are noted anterior to the base of the spleen. Adrenals/Urinary Tract: There is no adrenal mass. On the right no stone is seen or hydronephrosis. Bilaterally no contour deforming mass of either kidney. On the left there is increased moderate hydronephrosis and perinephric stranding, trace perinephric fluid without focal collection. There previously was a solitary 1.7 cm UPJ stone. Currently there are multiple punctate up to 2 mm stones in the left renal collecting system, larger 4 mm  stone inferiorly and a 5 mm UPJ stone on 2:36. There is moderate left hydroureter as well with the distal 7 cm of the left ureter filled with innumerable tiny stone fragments and sand like particles up to the UVJ. There are no intravesical stones or bladder thickening. The bladder unremarkable accounting for the degree of distention. Stomach/Bowel: Unremarkable unopacified stomach and small bowel. Normal retrocecal appendix. Scattered colonic diverticulosis without evidence of diverticulitis or colitis. Vascular/Lymphatic: Aortic atherosclerosis. No enlarged abdominal or pelvic lymph nodes.  Reproductive: Small calcified fundal uterine fibroid. The uterus and ovaries are otherwise unremarkable. Other: There is no pelvic ascites. There is no free hemorrhage, free air or abscess. As above there is only trace perinephric fluid. No space-occupying urinoma. There are no incarcerated hernias. Musculoskeletal: Again noted is advanced degenerative disc collapse at L2-3 and L4-5 with reactive endplate sclerosis, lesser degenerative change elsewhere in the spine. No acute or other significant osseous findings. IMPRESSION: 1. 5 mm left UPJ stone with moderate obstructive uropathy and perinephric stranding. Trace perinephric fluid without focal collection. Please correlate clinically for infectious complication. 2. The distal 7 cm of the left ureter is filled with innumerable tiny stone fragments and sand-like particles up to the UVJ. 3. Additional multiple multifocal small stones in the left renal collecting system. 4. Aortic and coronary artery atherosclerosis. 5. Diverticulosis without evidence of diverticulitis. 6. Small hiatal hernia. Aortic Atherosclerosis (ICD10-I70.0). Electronically Signed   By: Almira Bar M.D.   On: 02/05/2023 21:07   CT Renal Stone Study  Result Date: 01/27/2023 CLINICAL DATA:  Abdominal/flank pain, stone suspected. Left flank pain since this morning. EXAM: CT ABDOMEN AND PELVIS WITHOUT CONTRAST TECHNIQUE: Multidetector CT imaging of the abdomen and pelvis was performed following the standard protocol without IV contrast. RADIATION DOSE REDUCTION: This exam was performed according to the departmental dose-optimization program which includes automated exposure control, adjustment of the mA and/or kV according to patient size and/or use of iterative reconstruction technique. COMPARISON:  CT abdomen/pelvis 01/24/2014. FINDINGS: Lower chest: No acute abnormality. Hepatobiliary: No focal liver abnormality is seen. Status post cholecystectomy. No biliary dilatation. Pancreas:  Unremarkable. No pancreatic ductal dilatation or surrounding inflammatory changes. Spleen: Normal in size without focal abnormality. Adrenals/Urinary Tract: 17 mm obstructing stone at the left ureteral pelvic junction with associated mild left hydronephrosis. Adrenal glands are unremarkable. No renal calculi or hydronephrosis of the right kidney. Bladder is decompressed. Stomach/Bowel: Small hiatal hernia. No dilated loops of small bowel. Normal appendix is visualized on axial image 41 series 2. No bowel wall thickening or surrounding inflammation. Vascular/Lymphatic: Aortic atherosclerosis. No enlarged abdominal or pelvic lymph nodes. Reproductive: Uterus and bilateral adnexa are unremarkable. Other: No abdominal wall hernia or abnormality. No abdominopelvic ascites. Musculoskeletal: No acute or significant osseous findings. IMPRESSION: 1. 17 mm obstructing stone at the left ureteral pelvic junction with associated mild left hydronephrosis. 2. Small hiatal hernia. 3. Aortic Atherosclerosis (ICD10-I70.0). Electronically Signed   By: Orvan Falconer M.D.   On: 01/27/2023 15:07     Subjective: No acute issues or events overnight,  tolerated procedure well, otherwise stable for discharge   Discharge Exam: Vitals:   02/06/23 1036 02/06/23 1050  BP:  128/63  Pulse: 90 88  Resp: 20 16  Temp:  98.3 F (36.8 C)  SpO2: 98% 97%   Vitals:   02/06/23 1015 02/06/23 1027 02/06/23 1036 02/06/23 1050  BP: 127/68   128/63  Pulse: (!) 109 91 90 88  Resp: 13 16 20 16   Temp:  98.3 F (36.8 C)  TempSrc:    Oral  SpO2: 98% 96% 98% 97%  Weight:      Height:        General: Pt is alert, awake, not in acute distress Cardiovascular: RRR, S1/S2 +, no rubs, no gallops Respiratory: CTA bilaterally, no wheezing, no rhonchi Abdominal: Soft, NT, ND, bowel sounds + Extremities: no edema, no cyanosis    The results of significant diagnostics from this hospitalization (including imaging, microbiology, ancillary  and laboratory) are listed below for reference.     Microbiology: Recent Results (from the past 240 hour(s))  Urine Culture     Status: None   Collection Time: 01/27/23  3:39 PM   Specimen: Urine, Clean Catch  Result Value Ref Range Status   Specimen Description   Final    URINE, CLEAN CATCH Performed at [facility], [address] [place], [place] [address]    Special Requests   Final    NONE Performed at [facility], [address] [place], [place] [address]    Culture   Final    NO GROWTH Performed at [facility], [address], [place], [place] [address]    Report Status 01/28/2023 FINAL  Final  Blood culture (routine x 2)     Status: None (Preliminary result)   Collection Time: 02/05/23 10:05 PM   Specimen: BLOOD RIGHT ARM  Result Value Ref Range Status   Specimen Description BLOOD RIGHT ARM  Final   Special Requests   Final    BOTTLES DRAWN AEROBIC AND ANAEROBIC Blood Culture results may not be optimal due to an excessive volume of blood received in culture bottles Performed at [facility], [address], [place], [place] [address]    Culture PENDING  Incomplete   Report Status PENDING  Incomplete  Blood culture (routine x 2)     Status: None (Preliminary result)   Collection Time: 02/05/23 10:06 PM   Specimen: BLOOD LEFT ARM  Result Value Ref Range Status   Specimen Description BLOOD LEFT ARM  Final   Special Requests   Final    BOTTLES DRAWN AEROBIC AND ANAEROBIC Blood Culture results may not be optimal due to an excessive volume of blood received in culture bottles Performed at [facility], [address], [place], [place] [address]    Culture PENDING  Incomplete   Report Status PENDING  Incomplete     Labs: BNP (last 3 results) No results for input(s): "BNP" in the last 8760 hours. Basic Metabolic Panel: Recent Labs  Lab 02/05/23 2051 02/05/23 2200 02/06/23 0458  NA 132*  --  132*  K 2.8*  --  3.5  CL  97*  --  101  CO2 21*  --  22  GLUCOSE 108*  --  92  BUN 13  --  14  CREATININE 1.33*  --  1.32*  CALCIUM 9.4  --  8.3*  MG  --  1.7  --    Liver Function Tests: Recent Labs  Lab 02/05/23 2051 02/06/23 0458  AST 69* 62*  ALT 59* 58*  ALKPHOS 62 54  BILITOT 1.7* 1.5*  PROT 7.5 6.0*  ALBUMIN 4.2 3.3*   No results for input(s): "LIPASE", "AMYLASE" in the last 168 hours. No results for input(s): "AMMONIA" in the last 168 hours. CBC: Recent Labs  Lab 02/05/23 2051 02/06/23 0458  WBC 11.4* 8.2  NEUTROABS 9.4* 6.2  HGB 15.1*  12.9  HCT 42.4 37.2  MCV 86.2 87.5  PLT 218 182   Cardiac Enzymes: No results for input(s): "CKTOTAL", "CKMB", "CKMBINDEX", "TROPONINI" in the last 168 hours. BNP: Invalid input(s): "POCBNP" CBG: No results for input(s): "GLUCAP" in the last 168 hours. D-Dimer No results for input(s): "DDIMER" in the last 72 hours. Hgb A1c No results for input(s): "HGBA1C" in the last 72 hours. Lipid Profile No results for input(s): "CHOL", "HDL", "LDLCALC", "TRIG", "CHOLHDL", "LDLDIRECT" in the last 72 hours. Thyroid function studies No results for input(s): "TSH", "T4TOTAL", "T3FREE", "THYROIDAB" in the last 72 hours.  Invalid input(s): "FREET3" Anemia work up No results for input(s): "VITAMINB12", "FOLATE", "FERRITIN", "TIBC", "IRON", "RETICCTPCT" in the last 72 hours. Urinalysis    Component Value Date/Time   COLORURINE YELLOW 02/05/2023 1936   APPEARANCEUR HAZY (A) 02/05/2023 1936   LABSPEC 1.015 02/05/2023 1936   PHURINE 7.0 02/05/2023 1936   GLUCOSEU NEGATIVE 02/05/2023 1936   HGBUR LARGE (A) 02/05/2023 1936   BILIRUBINUR NEGATIVE 02/05/2023 1936   KETONESUR 20 (A) 02/05/2023 1936   PROTEINUR 30 (A) 02/05/2023 1936   UROBILINOGEN 0.2 01/24/2014 0050   NITRITE NEGATIVE 02/05/2023 1936   LEUKOCYTESUR MODERATE (A) 02/05/2023 1936   Sepsis Labs Recent Labs  Lab 02/05/23 2051 02/06/23 0458  WBC 11.4* 8.2   Microbiology Recent Results (from  the past 240 hour(s))  Urine Culture     Status: None   Collection Time: 01/27/23  3:39 PM   Specimen: Urine, Clean Catch  Result Value Ref Range Status   Specimen Description   Final    URINE, CLEAN CATCH Performed at [facility], [address] [place], [place] [address]    Special Requests   Final    NONE Performed at [facility], [address] [place], [place] [address]    Culture   Final    NO GROWTH Performed at [facility], [address], [place], [place] [address]    Report Status 01/28/2023 FINAL  Final  Blood culture (routine x 2)     Status: None (Preliminary result)   Collection Time: 02/05/23 10:05 PM   Specimen: BLOOD RIGHT ARM  Result Value Ref Range Status   Specimen Description BLOOD RIGHT ARM  Final   Special Requests   Final    BOTTLES DRAWN AEROBIC AND ANAEROBIC Blood Culture results may not be optimal due to an excessive volume of blood received in culture bottles Performed at [facility], [address], [place], [place] [address]    Culture PENDING  Incomplete   Report Status PENDING  Incomplete  Blood culture (routine x 2)     Status: None (Preliminary result)   Collection Time: 02/05/23 10:06 PM   Specimen: BLOOD LEFT ARM  Result Value Ref Range Status   Specimen Description BLOOD LEFT ARM  Final   Special Requests   Final    BOTTLES DRAWN AEROBIC AND ANAEROBIC Blood Culture results may not be optimal due to an excessive volume of blood received in culture bottles Performed at [facility], [address] [place], [place] [address]    Culture PENDING  Incomplete   Report Status PENDING  Incomplete     Time coordinating discharge: Over 30 minutes  SIGNED:   Azucena Fallen, DO [facility] 02/06/2023, 1:32 PM Pager   If 7PM-7AM, please contact night-coverage [email or web address]

## 2023-02-06 NOTE — Op Note (Signed)
OPERATIVE NOTE   Patient Name: Melissa Cole  MRN: [id number]   Date of Procedure: 02/06/23  Preoperative diagnosis:  Left ureteral calcui with steinstrasse s/p left ESL Left flank pain  Postoperative diagnosis:  Left ureteral calculi with steinstrasse s/p left ESL  Procedure:  Cystoscopy Left retrograde pyelogram Left ureteroscopy with laser lithotripsy Stone manipulation with multiple fragments obtained Insertion of the left ureteral stent (83F x 26 cm, no tether) Intraoperative fluoroscopy with interpretation  Attending: Milderd Meager, MD  Anesthesia: General  Estimated blood loss: 5 mL  Fluids: Per anesthesia record  Drains: 83F x 26 cm left ureteral stent, no tether  Specimens: Stone fragments sent for analysis  Antibiotics: Cefepime  Findings: Normal urethra and bladder; approximately 7 cm of stone fragments in the distal left ureter causing Steinstrasse with dilation of the left ureter and collecting system proximally  Indications:  72 year old female status post left ESL on 02/04/2023 for a 17 mm calculus at the left UPJ presents for surgical management of significant stone fragments in the distal left ureter causing obstruction.  She developed worsening left-sided flank pain with associated nausea and vomiting following lithotripsy.  She presented to the emergency room yesterday with worsened symptoms.  She was unable to control her symptoms with oral pain medication and antiemetics.  CT urogram showed moderate hydronephrosis on the left with approximately 7 cm of the distal left ureter filled with stone fragments consistent with Steinstrasse.  She was admitted to the hospital for pain control and IV hydration.  She presents now for cystoscopy, left retrograde pyelogram, left ureteroscopy, possible laser lithotripsy, stone manipulation, and insertion of left ureteral stent.  Risk and benefits of the procedure have been discussed in detail.  She understands  wishes to proceed as described.  Description of Procedure:  The patient had received IV cefepime preoperatively.  After successful induction of a general anesthetic, the patient was placed in the dorsolithotomy position.  The patient's genitalia was prepped and draped in sterile fashion.  Under direct visualization, a 21 French rigid cystoscope was passed through the urethra and into the bladder.  No stones were seen within the bladder.  No mucosal abnormalities were appreciated.  A normal-appearing trigone was noted with a single orifice bilaterally.  A left retrograde pyelogram was performed for evaluation of the patient's left upper tract given the findings on CT scan.  Scout film showed a nonspecific bowel gas pattern and no obvious bony abnormalities.  A 7 cm length of calcification was noted in the left pelvis which appeared to be within the left distal ureter.  Using a 5 [place] open-ended catheter, contrast was gently injected into the left ureter.  The 7 cm length of calcifications was confirmed to be within the ureter.  Contrast was noted to pass beyond this area and into a dilated left ureter and collecting system.  A sensor guidewire was passed through the open-ended catheter and into the left ureter.  I was able to negotiate the guidewire past the stone fragments and into the proximal left ureter and left renal pelvis.  A semirigid ureteroscope was then passed alongside the guidewire and into the left ureter.  Numerous stone fragments were noted.  I irrigated a number of these fragments out with the ureteroscope.  A n-gage stone basket was used to retrieve larger fragments.  Stone manipulation took a significant amount of time due to the number of fragments.  Care was taken to avoid any injury to the ureter during this procedure.  Several larger fragments remained.  I fragmented these with the holmium laser fiber.  Larger fragments were removed with the basket.  At this point I was able to pass  the ureteroscope into the proximal left ureter without difficulty.  All remaining stone fragments were quite small in size and I felt these would pass easily.  The ureteroscope was removed with inspection of the ureter.  There was no evidence of any ureteral injury.  A 6 French by 26 cm double-J stent was then passed over the guidewire and into the left renal pelvis under fluoroscopic and cystoscopic guidance.  Good position was confirmed with the proximal curl in upper calyx.  The tether was removed prior to stent placement.  The majority of stone fragments were then irrigated from the bladder.  These were sent for stone analysis.  The bladder was then drained and the cystoscope was removed.  The patient received intraurethral lidocaine jelly.  She was then extubated and taken to the post anesthesia care unit in stable condition.  Complications: None  Condition: Stable, extubated, transferred to PACU  Plan:  Continue stent for approximately 1 week. She may be stable for discharge home later today if her pain is controlled and she is tolerating a diet. She will need follow-up with Dr. Marlou Porch in approximately 1 week for stent removal.

## 2023-02-06 NOTE — Care Management Obs Status (Signed)
MEDICARE OBSERVATION STATUS NOTIFICATION   Patient Details  Name: Melissa Cole MRN: [id number] Date of Birth: 20-Mar-1951   Medicare Observation Status Notification Given:  Yes    Adrian Prows, RN 02/06/2023, 3:18 PM

## 2023-02-06 NOTE — Anesthesia Procedure Notes (Signed)
Procedure Name: LMA Insertion Date/Time: 02/06/2023 9:04 AM  Performed by: Kizzie Fantasia, CRNAPre-anesthesia Checklist: Emergency Drugs available, Patient identified, Suction available, Patient being monitored and Timeout performed Patient Re-evaluated:Patient Re-evaluated prior to induction Oxygen Delivery Method: Circle system utilized Preoxygenation: Pre-oxygenation with 100% oxygen Induction Type: IV induction Ventilation: Mask ventilation without difficulty LMA: LMA inserted LMA Size: 4.0 Number of attempts: 1 Placement Confirmation: positive ETCO2 and breath sounds checked- equal and bilateral Tube secured with: Tape Dental Injury: Teeth and Oropharynx as per pre-operative assessment

## 2023-02-07 ENCOUNTER — Encounter (HOSPITAL_COMMUNITY): Payer: Self-pay | Admitting: Urology

## 2023-02-07 LAB — CULTURE, BLOOD (ROUTINE X 2)

## 2023-02-08 LAB — CULTURE, BLOOD (ROUTINE X 2): Culture: NO GROWTH

## 2023-02-09 ENCOUNTER — Ambulatory Visit
Admission: RE | Admit: 2023-02-09 | Discharge: 2023-02-09 | Disposition: A | Discharge status: Home / Self Care | Payer: Medicare PPO | Source: Ambulatory Visit | Attending: Internal Medicine | Admitting: Internal Medicine

## 2023-02-09 DIAGNOSIS — Z1231 Encounter for screening mammogram for malignant neoplasm of breast: Secondary | ICD-10-CM

## 2023-02-11 DIAGNOSIS — N201 Calculus of ureter: Secondary | ICD-10-CM | POA: Diagnosis not present

## 2023-02-11 DIAGNOSIS — R8271 Bacteriuria: Secondary | ICD-10-CM | POA: Diagnosis not present

## 2023-02-11 DIAGNOSIS — N1330 Unspecified hydronephrosis: Secondary | ICD-10-CM | POA: Diagnosis not present

## 2023-02-11 LAB — CULTURE, BLOOD (ROUTINE X 2): Culture: NO GROWTH

## 2023-02-21 ENCOUNTER — Telehealth: Payer: Self-pay | Admitting: Urology

## 2023-02-21 NOTE — Telephone Encounter (Signed)
05/19 or 05/20 Dr was at [facility] and did surgery to remove a stone. The stone was sent fr evaluation and patient wants to know what kind of stone it is and what she should do now.  [phone]

## 2023-02-22 ENCOUNTER — Encounter: Payer: Self-pay | Admitting: Internal Medicine

## 2023-02-22 NOTE — Telephone Encounter (Signed)
Pt aware the results are not available yet and she would receive a call when they are ready.

## 2023-02-25 LAB — CALCULI, WITH PHOTOGRAPH (CLINICAL LAB)
Hydroxyapatite: 20 %
Weight Calculi: 203 mg

## 2023-03-07 ENCOUNTER — Encounter: Payer: Self-pay | Admitting: Internal Medicine

## 2023-03-21 DIAGNOSIS — N202 Calculus of kidney with calculus of ureter: Secondary | ICD-10-CM | POA: Diagnosis not present

## 2023-03-23 DIAGNOSIS — H5203 Hypermetropia, bilateral: Secondary | ICD-10-CM | POA: Diagnosis not present

## 2023-03-23 DIAGNOSIS — H52223 Regular astigmatism, bilateral: Secondary | ICD-10-CM | POA: Diagnosis not present

## 2023-03-23 DIAGNOSIS — H524 Presbyopia: Secondary | ICD-10-CM | POA: Diagnosis not present

## 2023-05-09 ENCOUNTER — Other Ambulatory Visit: Payer: Self-pay | Admitting: Internal Medicine

## 2023-05-09 ENCOUNTER — Ambulatory Visit (AMBULATORY_SURGERY_CENTER): Payer: Medicare PPO | Admitting: *Deleted

## 2023-05-09 ENCOUNTER — Encounter: Payer: Self-pay | Admitting: Internal Medicine

## 2023-05-09 ENCOUNTER — Telehealth: Payer: Self-pay | Admitting: *Deleted

## 2023-05-09 VITALS — Ht 64.0 in | Wt 160.0 lb

## 2023-05-09 DIAGNOSIS — Z86010 Personal history of colonic polyps: Secondary | ICD-10-CM

## 2023-05-09 MED ORDER — SUTAB 1479-225-188 MG PO TABS
24.0000 | ORAL_TABLET | ORAL | 0 refills | Status: DC
Start: 2023-05-09 — End: 2023-05-31

## 2023-05-09 MED ORDER — ONDANSETRON HCL 4 MG PO TABS: 4.0000 mg | ORAL_TABLET | Freq: Three times a day (TID) | ORAL | 0 refills | Status: AC | PRN

## 2023-05-09 NOTE — Telephone Encounter (Signed)
Attempt to reach pt for pre-visit. LM with call back #.  Will attempt to reach again in due to no other # listed in profile

## 2023-05-09 NOTE — Progress Notes (Signed)
Pt's name and DOB verified at the beginning of the pre-visit.  Pt denies any difficulty with ambulating,sitting, laying down or rolling side to side Gave both [facility] main # and MD on call # prior to instructions.  No egg or soy allergy known to patient  Has issues with PONV   Pt denies having issues being intubated Pt has no issues moving head neck or swallowing No FH of Malignant Hyperthermia Pt is not on diet pills Pt is not on home 02  Pt is not on blood thinners  Pt denies issues with constipation  Pt is not on dialysis Pt denise any abnormal heart rhythms  Pt denies any upcoming cardiac testing Pt encouraged to use to use Singlecare or Goodrx to reduce cost  Patient's chart reviewed by Cathlyn Parsons CNRA prior to pre-visit and patient appropriate for the [facility].  Pre-visit completed and red dot placed by patient's name on their procedure day (on provider's schedule).  . Visit by phone Pt states weight is 160 lb Instructed pt why it is important to and  to call if they have any changes in health or new medications. Directed them to the # given and on instructions.   Pt states they will.  Instructions reviewed with pt and pt states understanding. Instructed to review again prior to procedure. Pt states they will.  Instructions sent by mail with coupon and by my chart

## 2023-05-09 NOTE — Telephone Encounter (Signed)
2nd attempt to reach and had to LM. Gave # and instructions for pt to call a # given and reschedule her pre-visit by end of day or her procedure will be canceled.

## 2023-05-10 ENCOUNTER — Telehealth: Payer: Self-pay | Admitting: *Deleted

## 2023-05-10 DIAGNOSIS — Z86010 Personal history of colonic polyps: Secondary | ICD-10-CM

## 2023-05-10 MED ORDER — CLENPIQ 10-3.5-12 MG-GM -GM/160ML PO SOLN
1.0000 | ORAL | 0 refills | Status: DC
Start: 2023-05-10 — End: 2023-05-31

## 2023-05-10 NOTE — Telephone Encounter (Signed)
Note from pharmacy saying Melissa Cole is not covered by insurance. Called pt and informed her of what message stated. Pt is to call her insurance and see how much it is going to be out of pocket.and will call back if it is cost prohibitive. She stated she "Really does not want to do any liquid"

## 2023-05-10 NOTE — Telephone Encounter (Signed)
Pt agreed to use Clenpiq for colonoscopy.New instructions and med sent to pharmacy.

## 2023-05-10 NOTE — Telephone Encounter (Signed)
Inbound call from patient stating she spoke with insurance and they recommended Clenpiq. Please advise, thank you.

## 2023-05-10 NOTE — Telephone Encounter (Signed)
Attempted to reach pt. LM with call back #.

## 2023-05-10 NOTE — Telephone Encounter (Signed)
Patient is returning your call.  

## 2023-05-10 NOTE — Telephone Encounter (Signed)
Went over education of new prep but pt wants to read it in my chart and call if has any questions.

## 2023-05-13 ENCOUNTER — Other Ambulatory Visit: Payer: Self-pay | Admitting: Internal Medicine

## 2023-05-13 ENCOUNTER — Telehealth: Payer: Self-pay | Admitting: *Deleted

## 2023-05-13 DIAGNOSIS — Z86010 Personal history of colonic polyps: Secondary | ICD-10-CM

## 2023-05-13 MED ORDER — NA SULFATE-K SULFATE-MG SULF 17.5-3.13-1.6 GM/177ML PO SOLN
1.0000 | Freq: Once | ORAL | 0 refills | Status: AC
Start: 2023-05-13 — End: 2023-05-13

## 2023-05-13 NOTE — Telephone Encounter (Signed)
Pt called and new orders for Suprep to be placed and new instructions to be made and sent to pt by mail and my chart.

## 2023-05-13 NOTE — Telephone Encounter (Signed)
Pt received new instructions on Clenpiq which was ordered for her. Pt stated she understood all instructions.

## 2023-05-13 NOTE — Telephone Encounter (Signed)
Medication change discussed with pt and brief education on differences. Pt states she will read over instructions.

## 2023-05-20 NOTE — Telephone Encounter (Signed)
New med has been sent and pt is aware and knows instructions have been sent and will look at them.

## 2023-05-31 ENCOUNTER — Encounter: Payer: Self-pay | Admitting: Internal Medicine

## 2023-05-31 ENCOUNTER — Ambulatory Visit (AMBULATORY_SURGERY_CENTER): Payer: Medicare PPO | Admitting: Internal Medicine

## 2023-05-31 VITALS — BP 116/69 | HR 98 | Temp 97.2°F | Resp 23 | Ht 64.0 in | Wt 160.0 lb

## 2023-05-31 DIAGNOSIS — I4891 Unspecified atrial fibrillation: Secondary | ICD-10-CM | POA: Diagnosis not present

## 2023-05-31 DIAGNOSIS — Z86010 Personal history of colonic polyps: Secondary | ICD-10-CM | POA: Diagnosis not present

## 2023-05-31 DIAGNOSIS — I10 Essential (primary) hypertension: Secondary | ICD-10-CM | POA: Diagnosis not present

## 2023-05-31 DIAGNOSIS — Z09 Encounter for follow-up examination after completed treatment for conditions other than malignant neoplasm: Secondary | ICD-10-CM | POA: Diagnosis not present

## 2023-05-31 DIAGNOSIS — Z1211 Encounter for screening for malignant neoplasm of colon: Secondary | ICD-10-CM | POA: Diagnosis not present

## 2023-05-31 MED ORDER — SODIUM CHLORIDE 0.9 % IV SOLN: 500.0000 mL | Freq: Once | INTRAVENOUS | Status: DC

## 2023-05-31 NOTE — Progress Notes (Signed)
Pt's states no medical or surgical changes since previsit or office visit. 

## 2023-05-31 NOTE — Progress Notes (Signed)
To pacu, VSS. Report to Rn.tb 

## 2023-05-31 NOTE — Progress Notes (Signed)
GASTROENTEROLOGY PROCEDURE H&P NOTE   Primary Care Physician: [name], MD    Reason for Procedure:  History of adenomatous colon polyps including 1 greater than a centimeter at last exam  Plan:    Colonoscopy  Patient is appropriate for endoscopic procedure(s) in the ambulatory ([facility]) setting.  The nature of the procedure, as well as the risks, benefits, and alternatives were carefully and thoroughly reviewed with the patient. Ample time for discussion and questions allowed. The patient understood, was satisfied, and agreed to proceed.     HPI: Melissa Cole is a 72 y.o. female who presents for surveillance colonoscopy.  Medical history as below.  Tolerated the prep.  No recent chest pain or shortness of breath.  No abdominal pain today.  Past Medical History:  Diagnosis Date   Allergy    Atrial fibrillation (HCC)    Basal cell carcinoma    Blood transfusion without reported diagnosis 1969   Cataract    Gallstones    Hepatic cyst    Hypertension    Kidney stones    Mitral valve prolapse    Post-operative nausea and vomiting    Stage 2 chronic kidney disease    Uterine fibroids affecting pregnancy     Past Surgical History:  Procedure Laterality Date   BREAST BIOPSY Left 01/12/2020   CHOLECYSTECTOMY  1998   COLONOSCOPY  12/24/2014   CYSTOSCOPY W/ URETERAL STENT PLACEMENT Left 02/06/2023   Procedure: CYSTOSCOPY WITH RETROGRADE PYELOGRAM/URETERAL STENT PLACEMENT;  Surgeon: Milderd Meager., MD;  Location: [facility];  Service: Urology;  Laterality: Left;   EXTRACORPOREAL SHOCK WAVE LITHOTRIPSY Left 02/04/2023   Procedure: LEFT EXTRACORPOREAL SHOCK WAVE LITHOTRIPSY (ESWL);  Surgeon: Crist Fat, MD;  Location: [facility];  Service: Urology;  Laterality: Left;  75 MINUTES   MOHS SURGERY     basal    POLYPECTOMY     SQUAMOUS CELL CARCINOMA EXCISION     URETEROSCOPY WITH HOLMIUM LASER LITHOTRIPSY Left 02/06/2023   Procedure:  URETEROSCOPY WITH HOLMIUM LASER LITHOTRIPSY;  Surgeon: Milderd Meager., MD;  Location: [facility];  Service: Urology;  Laterality: Left;    Prior to Admission medications   Medication Sig Start Date End Date Taking? Authorizing Provider  Cholecalciferol (VITAMIN D3 PO) Take 1 tablet by mouth every other day.   Yes [provider]  co-enzyme Q-10 30 MG capsule Take 30 mg by mouth daily.   Yes [provider]  Cyanocobalamin (VITAMIN B-12 PO) Take 3,000 mcg by mouth every other day.   Yes [provider]  estradiol-norethindrone (ACTIVELLA) 1-0.5 MG tablet Take 1 tablet by mouth every other day.   Yes [provider]  furosemide (LASIX) 20 MG tablet Take 20 mg by mouth daily as needed. 03/02/23  Yes [provider]  Multiple Vitamin (MULTIVITAMIN) LIQD Take 5 mLs by mouth daily.   Yes [provider]  multivitamin-lutein (OCUVITE-LUTEIN) CAPS capsule Take 1 capsule by mouth daily.   Yes [provider]  ondansetron (ZOFRAN) 4 MG tablet Take 1 tablet (4 mg total) by mouth every 8 (eight) hours as needed for nausea or vomiting. 05/09/23  Yes Jeffrie Lofstrom, Carie Caddy, MD  OVER THE COUNTER MEDICATION Take 2 capsules by mouth daily. Ultimate Oil (fish oil supplement) 1280 mg + CoQQ 10   Yes [provider]  potassium chloride (KLOR-CON) 10 MEQ tablet Take 10 mEq by mouth daily. 03/02/23  Yes [provider]  rosuvastatin (CRESTOR) 5 MG tablet Take 5  mg by mouth daily.   Yes [provider]  telmisartan (MICARDIS) 40 MG tablet Take 40 mg by mouth at bedtime. 02/25/23  Yes [provider]  traMADol (ULTRAM) 50 MG tablet Take 1 tablet (50 mg total) by mouth every 6 (six) hours as needed. Patient taking differently: Take 50 mg by mouth every 6 (six) hours as needed for moderate pain. 02/04/23 02/04/24 Yes Crist Fat, MD  HYDROcodone-acetaminophen (NORCO/VICODIN) 5-325 MG tablet Take 1 tablet by mouth every 6 (six)  hours as needed for moderate pain. Patient not taking: Reported on 05/09/2023    [provider]    Current Outpatient Medications  Medication Sig Dispense Refill   Cholecalciferol (VITAMIN D3 PO) Take 1 tablet by mouth every other day.     co-enzyme Q-10 30 MG capsule Take 30 mg by mouth daily.     Cyanocobalamin (VITAMIN B-12 PO) Take 3,000 mcg by mouth every other day.     estradiol-norethindrone (ACTIVELLA) 1-0.5 MG tablet Take 1 tablet by mouth every other day.     furosemide (LASIX) 20 MG tablet Take 20 mg by mouth daily as needed.     Multiple Vitamin (MULTIVITAMIN) LIQD Take 5 mLs by mouth daily.     multivitamin-lutein (OCUVITE-LUTEIN) CAPS capsule Take 1 capsule by mouth daily.     ondansetron (ZOFRAN) 4 MG tablet Take 1 tablet (4 mg total) by mouth every 8 (eight) hours as needed for nausea or vomiting. 2 tablet 0   OVER THE COUNTER MEDICATION Take 2 capsules by mouth daily. Ultimate Oil (fish oil supplement) 1280 mg + CoQQ 10     potassium chloride (KLOR-CON) 10 MEQ tablet Take 10 mEq by mouth daily.     rosuvastatin (CRESTOR) 5 MG tablet Take 5 mg by mouth daily.     telmisartan (MICARDIS) 40 MG tablet Take 40 mg by mouth at bedtime.     traMADol (ULTRAM) 50 MG tablet Take 1 tablet (50 mg total) by mouth every 6 (six) hours as needed. (Patient taking differently: Take 50 mg by mouth every 6 (six) hours as needed for moderate pain.) 20 tablet 0   HYDROcodone-acetaminophen (NORCO/VICODIN) 5-325 MG tablet Take 1 tablet by mouth every 6 (six) hours as needed for moderate pain. (Patient not taking: Reported on 05/09/2023)     Current Facility-Administered Medications  Medication Dose Route Frequency Provider Last Rate Last Admin   0.9 %  sodium chloride infusion  500 mL Intravenous Once Treavon Castilleja, Carie Caddy, MD        Allergies as of 05/31/2023 - Review Complete 05/31/2023  Allergen Reaction Noted   Amlodipine Cough 12/09/2014   Benazepril Other (See Comments) 12/09/2014    Codeine Other (See Comments) 03/26/2011   Iodine Itching 03/26/2011   Mushroom ext cmplx(shiitake-reishi-mait) Nausea Only 10/29/2011   Other Nausea Only 10/29/2011   Shiitake mushroom  01/23/2020   Latex Rash 02/24/2018   Oxycodone-acetaminophen Nausea Only 12/04/2014   Tape Rash 11/19/2014    Family History  Problem Relation Age of Onset   Heart attack Mother    Allergies Mother        food sensitivities   Heart attack Maternal Uncle        x 2   Stomach cancer Maternal Grandmother    Diabetes Maternal Aunt        great aunt   Diabetes Paternal Aunt    Colon cancer Neg Hx    Esophageal cancer Neg Hx    Rectal cancer Neg Hx  Colon polyps Neg Hx     Social History   Socioeconomic History   Marital status: Widowed    Spouse name: Not on file   Number of children: 2   Years of education: Not on file   Highest education level: Not on file  Occupational History   Occupation: dental assistant/ retired  Tobacco Use   Smoking status: Never   Smokeless tobacco: Never  Vaping Use   Vaping status: Never Used  Substance and Sexual Activity   Alcohol use: Not Currently    Comment: occ    Drug use: No   Sexual activity: Not on file  Other Topics Concern   Not on file  Social History Narrative   Not on file   Social Determinants of Health   Financial Resource Strain: Not on file  Food Insecurity: No Food Insecurity (02/06/2023)   Hunger Vital Sign    Worried About Running Out of Food in the Last Year: Never true    Ran Out of Food in the Last Year: Never true  Transportation Needs: No Transportation Needs (02/06/2023)   PRAPARE - Administrator, Civil Service (Medical): No    Lack of Transportation (Non-Medical): No  Physical Activity: Not on file  Stress: Not on file  Social Connections: Not on file  Intimate Partner Violence: Not At Risk (02/06/2023)   Humiliation, Afraid, Rape, and Kick questionnaire    Fear of Current or Ex-Partner: No     Emotionally Abused: No    Physically Abused: No    Sexually Abused: No    Physical Exam: Vital signs in last 24 hours: @BP  (!) 159/83   Pulse 98   Temp (!) 97.2 F (36.2 C) (Temporal)   Ht 5\' 4"  (1.626 m)   Wt 160 lb (72.6 kg)   SpO2 98%   BMI 27.46 kg/m  GEN: NAD EYE: Sclerae anicteric ENT: MMM CV: Non-tachycardic Pulm: CTA b/l GI: Soft, NT/ND NEURO:  Alert & Oriented x 3   Erick Blinks, MD [facility]  05/31/2023 11:10 AM

## 2023-05-31 NOTE — Op Note (Signed)
[facility] Patient Name: Melissa Cole Procedure Date: 05/31/2023 11:11 AM MRN: [id number] Endoscopist: Beverley Fiedler , MD, [id number] Age: 72 Referring MD:  Date of Birth: [birth year] Gender: Female Account #: [id number] Procedure:                Colonoscopy Indications:              High risk colon cancer surveillance: Personal                            history of adenoma (10 mm or greater in size), Last                            colonoscopy: May 2021 (15 mm cecal adenoma and 1                            addition adenoma < 1 cm) Medicines:                Monitored Anesthesia Care Procedure:                Pre-Anesthesia Assessment:                           - Prior to the procedure, a History and Physical                            was performed, and patient medications and                            allergies were reviewed. The patient's tolerance of                            previous anesthesia was also reviewed. The risks                            and benefits of the procedure and the sedation                            options and risks were discussed with the patient.                            All questions were answered, and informed consent                            was obtained. Prior Anticoagulants: The patient has                            taken no anticoagulant or antiplatelet agents. ASA                            Grade Assessment: II - A patient with mild systemic                            disease. After reviewing the risks and benefits,  the patient was deemed in satisfactory condition to                            undergo the procedure.                           After obtaining informed consent, the colonoscope                            was passed under direct vision. Throughout the                            procedure, the patient's blood pressure, pulse, and                            oxygen saturations were monitored  continuously. The                            [id number]-[id number] Colonoscope [id number] was introduced                            through the anus and advanced to the terminal                            ileum. The colonoscopy was performed without                            difficulty. The patient tolerated the procedure                            well. The quality of the bowel preparation was good                            (2 day prep used). The ileocecal valve, appendiceal                            orifice, and rectum were photographed. Scope In: 11:19:08 AM Scope Out: 11:32:01 AM Scope Withdrawal Time: 0 hours 10 minutes 42 seconds  Total Procedure Duration: 0 hours 12 minutes 53 seconds  Findings:                 The digital rectal exam was normal.                           The terminal ileum appeared normal.                           A few small-mouthed diverticula were found in the                            sigmoid colon and transverse colon.                           The exam was otherwise without abnormality on  direct and retroflexion views. Complications:            No immediate complications. Estimated Blood Loss:     Estimated blood loss: none. Impression:               - The examined portion of the ileum was normal.                           - Mild diverticulosis in the sigmoid colon and in                            the transverse colon.                           - The examination was otherwise normal on direct                            and retroflexion views.                           - No specimens collected. Recommendation:           - Patient has a contact number available for                            emergencies. The signs and symptoms of potential                            delayed complications were discussed with the                            patient. Return to normal activities tomorrow.                            Written discharge  instructions were provided to the                            patient.                           - Resume previous diet.                           - Continue present medications.                           - Repeat colonoscopy in 5 years for surveillance. Beverley Fiedler, MD 05/31/2023 11:34:22 AM This report has been signed electronically.

## 2023-05-31 NOTE — Patient Instructions (Addendum)
- 

## 2023-06-01 ENCOUNTER — Telehealth: Payer: Self-pay | Admitting: *Deleted

## 2023-06-01 NOTE — Telephone Encounter (Signed)
  Follow up Call-     05/31/2023   10:34 AM  Call back number  Post procedure Call Back phone  # [phone]  Permission to leave phone message Yes     Patient questions:  Do you have a fever, pain , or abdominal swelling? No. Pain Score  0 *  Have you tolerated food without any problems? Yes.    Have you been able to return to your normal activities? Yes.    Do you have any questions about your discharge instructions: Diet   No. Medications  No. Follow up visit  No.  Do you have questions or concerns about your Care? No.  Actions: * If pain score is 4 or above: No action needed, pain <4.

## 2023-07-01 DIAGNOSIS — Z85828 Personal history of other malignant neoplasm of skin: Secondary | ICD-10-CM | POA: Diagnosis not present

## 2023-07-01 DIAGNOSIS — D485 Neoplasm of uncertain behavior of skin: Secondary | ICD-10-CM | POA: Diagnosis not present

## 2023-07-01 DIAGNOSIS — D2372 Other benign neoplasm of skin of left lower limb, including hip: Secondary | ICD-10-CM | POA: Diagnosis not present

## 2023-07-01 DIAGNOSIS — Z08 Encounter for follow-up examination after completed treatment for malignant neoplasm: Secondary | ICD-10-CM | POA: Diagnosis not present

## 2023-07-01 DIAGNOSIS — L821 Other seborrheic keratosis: Secondary | ICD-10-CM | POA: Diagnosis not present

## 2023-07-01 DIAGNOSIS — L570 Actinic keratosis: Secondary | ICD-10-CM | POA: Diagnosis not present

## 2023-07-01 DIAGNOSIS — L601 Onycholysis: Secondary | ICD-10-CM | POA: Diagnosis not present

## 2023-07-01 DIAGNOSIS — D225 Melanocytic nevi of trunk: Secondary | ICD-10-CM | POA: Diagnosis not present

## 2023-07-01 DIAGNOSIS — L814 Other melanin hyperpigmentation: Secondary | ICD-10-CM | POA: Diagnosis not present

## 2023-07-18 DIAGNOSIS — N200 Calculus of kidney: Secondary | ICD-10-CM | POA: Diagnosis not present

## 2023-07-18 DIAGNOSIS — N1330 Unspecified hydronephrosis: Secondary | ICD-10-CM | POA: Diagnosis not present

## 2023-09-19 DIAGNOSIS — I10 Essential (primary) hypertension: Secondary | ICD-10-CM | POA: Diagnosis not present

## 2023-09-19 DIAGNOSIS — Z0000 Encounter for general adult medical examination without abnormal findings: Secondary | ICD-10-CM | POA: Diagnosis not present

## 2023-09-19 DIAGNOSIS — Z0189 Encounter for other specified special examinations: Secondary | ICD-10-CM | POA: Diagnosis not present

## 2023-09-19 DIAGNOSIS — Z1212 Encounter for screening for malignant neoplasm of rectum: Secondary | ICD-10-CM | POA: Diagnosis not present

## 2023-09-19 DIAGNOSIS — E538 Deficiency of other specified B group vitamins: Secondary | ICD-10-CM | POA: Diagnosis not present

## 2023-09-19 DIAGNOSIS — D649 Anemia, unspecified: Secondary | ICD-10-CM | POA: Diagnosis not present

## 2023-09-19 DIAGNOSIS — E559 Vitamin D deficiency, unspecified: Secondary | ICD-10-CM | POA: Diagnosis not present

## 2023-09-19 DIAGNOSIS — E785 Hyperlipidemia, unspecified: Secondary | ICD-10-CM | POA: Diagnosis not present

## 2023-09-19 DIAGNOSIS — R7301 Impaired fasting glucose: Secondary | ICD-10-CM | POA: Diagnosis not present

## 2023-09-27 DIAGNOSIS — N1831 Chronic kidney disease, stage 3a: Secondary | ICD-10-CM | POA: Diagnosis not present

## 2023-09-27 DIAGNOSIS — J309 Allergic rhinitis, unspecified: Secondary | ICD-10-CM | POA: Diagnosis not present

## 2023-09-27 DIAGNOSIS — G43909 Migraine, unspecified, not intractable, without status migrainosus: Secondary | ICD-10-CM | POA: Diagnosis not present

## 2023-09-27 DIAGNOSIS — I129 Hypertensive chronic kidney disease with stage 1 through stage 4 chronic kidney disease, or unspecified chronic kidney disease: Secondary | ICD-10-CM | POA: Diagnosis not present

## 2023-09-27 DIAGNOSIS — I10 Essential (primary) hypertension: Secondary | ICD-10-CM | POA: Diagnosis not present

## 2023-09-27 DIAGNOSIS — E785 Hyperlipidemia, unspecified: Secondary | ICD-10-CM | POA: Diagnosis not present

## 2023-09-27 DIAGNOSIS — Z0000 Encounter for general adult medical examination without abnormal findings: Secondary | ICD-10-CM | POA: Diagnosis not present

## 2023-09-27 DIAGNOSIS — J479 Bronchiectasis, uncomplicated: Secondary | ICD-10-CM | POA: Diagnosis not present

## 2023-09-27 DIAGNOSIS — K219 Gastro-esophageal reflux disease without esophagitis: Secondary | ICD-10-CM | POA: Diagnosis not present

## 2023-10-25 DIAGNOSIS — H02885 Meibomian gland dysfunction left lower eyelid: Secondary | ICD-10-CM | POA: Diagnosis not present

## 2023-10-25 DIAGNOSIS — H25013 Cortical age-related cataract, bilateral: Secondary | ICD-10-CM | POA: Diagnosis not present

## 2023-10-25 DIAGNOSIS — H2513 Age-related nuclear cataract, bilateral: Secondary | ICD-10-CM | POA: Diagnosis not present

## 2023-12-26 ENCOUNTER — Other Ambulatory Visit: Payer: Self-pay | Admitting: Internal Medicine

## 2023-12-26 DIAGNOSIS — Z1231 Encounter for screening mammogram for malignant neoplasm of breast: Secondary | ICD-10-CM

## 2024-01-02 DIAGNOSIS — D225 Melanocytic nevi of trunk: Secondary | ICD-10-CM | POA: Diagnosis not present

## 2024-01-02 DIAGNOSIS — Z85828 Personal history of other malignant neoplasm of skin: Secondary | ICD-10-CM | POA: Diagnosis not present

## 2024-01-02 DIAGNOSIS — L570 Actinic keratosis: Secondary | ICD-10-CM | POA: Diagnosis not present

## 2024-01-02 DIAGNOSIS — H0015 Chalazion left lower eyelid: Secondary | ICD-10-CM | POA: Diagnosis not present

## 2024-01-02 DIAGNOSIS — Z872 Personal history of diseases of the skin and subcutaneous tissue: Secondary | ICD-10-CM | POA: Diagnosis not present

## 2024-01-02 DIAGNOSIS — Z08 Encounter for follow-up examination after completed treatment for malignant neoplasm: Secondary | ICD-10-CM | POA: Diagnosis not present

## 2024-01-02 DIAGNOSIS — L821 Other seborrheic keratosis: Secondary | ICD-10-CM | POA: Diagnosis not present

## 2024-01-02 DIAGNOSIS — L814 Other melanin hyperpigmentation: Secondary | ICD-10-CM | POA: Diagnosis not present

## 2024-01-02 DIAGNOSIS — L400 Psoriasis vulgaris: Secondary | ICD-10-CM | POA: Diagnosis not present

## 2024-01-12 DIAGNOSIS — R058 Other specified cough: Secondary | ICD-10-CM | POA: Diagnosis not present

## 2024-01-12 DIAGNOSIS — I10 Essential (primary) hypertension: Secondary | ICD-10-CM | POA: Diagnosis not present

## 2024-01-12 DIAGNOSIS — J309 Allergic rhinitis, unspecified: Secondary | ICD-10-CM | POA: Diagnosis not present

## 2024-01-20 DIAGNOSIS — N132 Hydronephrosis with renal and ureteral calculous obstruction: Secondary | ICD-10-CM | POA: Diagnosis not present

## 2024-02-10 ENCOUNTER — Ambulatory Visit
Admission: RE | Admit: 2024-02-10 | Discharge: 2024-02-10 | Disposition: A | Discharge status: Home / Self Care | Source: Ambulatory Visit | Attending: Internal Medicine | Admitting: Internal Medicine

## 2024-02-10 DIAGNOSIS — Z1231 Encounter for screening mammogram for malignant neoplasm of breast: Secondary | ICD-10-CM

## 2024-02-16 ENCOUNTER — Other Ambulatory Visit: Payer: Self-pay | Admitting: Internal Medicine

## 2024-02-16 DIAGNOSIS — R928 Other abnormal and inconclusive findings on diagnostic imaging of breast: Secondary | ICD-10-CM

## 2024-02-27 ENCOUNTER — Ambulatory Visit
Admission: RE | Admit: 2024-02-27 | Discharge: 2024-02-27 | Disposition: A | Discharge status: Home / Self Care | Source: Ambulatory Visit | Attending: Internal Medicine | Admitting: Internal Medicine

## 2024-02-27 DIAGNOSIS — R928 Other abnormal and inconclusive findings on diagnostic imaging of breast: Secondary | ICD-10-CM

## 2024-02-28 ENCOUNTER — Other Ambulatory Visit: Payer: Self-pay | Admitting: Internal Medicine

## 2024-02-28 DIAGNOSIS — N6489 Other specified disorders of breast: Secondary | ICD-10-CM

## 2024-03-01 ENCOUNTER — Ambulatory Visit
Admission: RE | Admit: 2024-03-01 | Discharge: 2024-03-01 | Disposition: A | Discharge status: Home / Self Care | Source: Ambulatory Visit | Attending: Internal Medicine | Admitting: Internal Medicine

## 2024-03-01 ENCOUNTER — Other Ambulatory Visit

## 2024-03-01 ENCOUNTER — Encounter

## 2024-03-01 DIAGNOSIS — N6021 Fibroadenosis of right breast: Secondary | ICD-10-CM | POA: Diagnosis not present

## 2024-03-01 DIAGNOSIS — N6489 Other specified disorders of breast: Secondary | ICD-10-CM

## 2024-03-01 DIAGNOSIS — R928 Other abnormal and inconclusive findings on diagnostic imaging of breast: Secondary | ICD-10-CM | POA: Diagnosis not present

## 2024-03-01 DIAGNOSIS — R92321 Mammographic fibroglandular density, right breast: Secondary | ICD-10-CM | POA: Diagnosis not present

## 2024-03-01 HISTORY — PX: BREAST BIOPSY: SHX20

## 2024-03-02 LAB — SURGICAL PATHOLOGY

## 2024-03-14 ENCOUNTER — Other Ambulatory Visit: Payer: Self-pay | Admitting: Internal Medicine

## 2024-03-14 DIAGNOSIS — N6489 Other specified disorders of breast: Secondary | ICD-10-CM

## 2024-03-26 DIAGNOSIS — H52223 Regular astigmatism, bilateral: Secondary | ICD-10-CM | POA: Diagnosis not present

## 2024-03-26 DIAGNOSIS — H524 Presbyopia: Secondary | ICD-10-CM | POA: Diagnosis not present

## 2024-03-26 DIAGNOSIS — H5203 Hypermetropia, bilateral: Secondary | ICD-10-CM | POA: Diagnosis not present

## 2024-05-10 DIAGNOSIS — N200 Calculus of kidney: Secondary | ICD-10-CM | POA: Diagnosis not present

## 2024-05-10 DIAGNOSIS — I10 Essential (primary) hypertension: Secondary | ICD-10-CM | POA: Diagnosis not present

## 2024-05-10 DIAGNOSIS — N390 Urinary tract infection, site not specified: Secondary | ICD-10-CM | POA: Diagnosis not present

## 2024-05-10 DIAGNOSIS — R109 Unspecified abdominal pain: Secondary | ICD-10-CM | POA: Diagnosis not present

## 2024-05-10 DIAGNOSIS — R310 Gross hematuria: Secondary | ICD-10-CM | POA: Diagnosis not present

## 2024-05-10 DIAGNOSIS — N1831 Chronic kidney disease, stage 3a: Secondary | ICD-10-CM | POA: Diagnosis not present

## 2024-05-11 ENCOUNTER — Other Ambulatory Visit: Payer: Self-pay | Admitting: Adult Health

## 2024-05-11 DIAGNOSIS — N200 Calculus of kidney: Secondary | ICD-10-CM

## 2024-05-11 DIAGNOSIS — R310 Gross hematuria: Secondary | ICD-10-CM

## 2024-05-11 DIAGNOSIS — R109 Unspecified abdominal pain: Secondary | ICD-10-CM

## 2024-05-14 ENCOUNTER — Ambulatory Visit
Admission: RE | Admit: 2024-05-14 | Discharge: 2024-05-14 | Disposition: A | Discharge status: Home / Self Care | Source: Ambulatory Visit | Attending: Adult Health | Admitting: Adult Health

## 2024-05-14 DIAGNOSIS — N200 Calculus of kidney: Secondary | ICD-10-CM

## 2024-05-14 DIAGNOSIS — N202 Calculus of kidney with calculus of ureter: Secondary | ICD-10-CM | POA: Diagnosis not present

## 2024-05-14 DIAGNOSIS — R109 Unspecified abdominal pain: Secondary | ICD-10-CM

## 2024-05-14 DIAGNOSIS — R310 Gross hematuria: Secondary | ICD-10-CM

## 2024-05-15 ENCOUNTER — Other Ambulatory Visit: Payer: Self-pay | Admitting: Urology

## 2024-05-15 DIAGNOSIS — N202 Calculus of kidney with calculus of ureter: Secondary | ICD-10-CM | POA: Diagnosis not present

## 2024-05-16 ENCOUNTER — Ambulatory Visit (HOSPITAL_COMMUNITY): Admitting: Certified Registered"

## 2024-05-16 ENCOUNTER — Ambulatory Visit (HOSPITAL_COMMUNITY)

## 2024-05-16 ENCOUNTER — Ambulatory Visit (HOSPITAL_COMMUNITY)
Admission: RE | Admit: 2024-05-16 | Discharge: 2024-05-16 | Disposition: A | Discharge status: Home / Self Care | Source: Ambulatory Visit | Attending: Urology | Admitting: Urology

## 2024-05-16 ENCOUNTER — Encounter (HOSPITAL_COMMUNITY)
Admission: RE | Disposition: A | Discharge status: Home / Self Care | Payer: Self-pay | Source: Ambulatory Visit | Attending: Urology

## 2024-05-16 ENCOUNTER — Encounter (HOSPITAL_COMMUNITY): Payer: Self-pay | Admitting: Urology

## 2024-05-16 DIAGNOSIS — N132 Hydronephrosis with renal and ureteral calculous obstruction: Secondary | ICD-10-CM | POA: Diagnosis not present

## 2024-05-16 DIAGNOSIS — E669 Obesity, unspecified: Secondary | ICD-10-CM | POA: Diagnosis not present

## 2024-05-16 DIAGNOSIS — I129 Hypertensive chronic kidney disease with stage 1 through stage 4 chronic kidney disease, or unspecified chronic kidney disease: Secondary | ICD-10-CM | POA: Diagnosis not present

## 2024-05-16 DIAGNOSIS — N202 Calculus of kidney with calculus of ureter: Secondary | ICD-10-CM

## 2024-05-16 DIAGNOSIS — Z01818 Encounter for other preprocedural examination: Secondary | ICD-10-CM

## 2024-05-16 DIAGNOSIS — N182 Chronic kidney disease, stage 2 (mild): Secondary | ICD-10-CM | POA: Insufficient documentation

## 2024-05-16 HISTORY — PX: CYSTOSCOPY W/ RETROGRADES: SHX1426

## 2024-05-16 HISTORY — PX: CYSTOSCOPY/URETEROSCOPY/HOLMIUM LASER/STENT PLACEMENT: SHX6546

## 2024-05-16 LAB — CBC WITH DIFFERENTIAL/PLATELET
Abs Immature Granulocytes: 0.01 K/uL (ref 0.00–0.07)
Basophils Absolute: 0.1 K/uL (ref 0.0–0.1)
Basophils Relative: 2 %
Eosinophils Absolute: 0 K/uL (ref 0.0–0.5)
Eosinophils Relative: 0 %
HCT: 42.8 % (ref 36.0–46.0)
Hemoglobin: 14.4 g/dL (ref 12.0–15.0)
Immature Granulocytes: 0 %
Lymphocytes Relative: 35 %
Lymphs Abs: 1.3 K/uL (ref 0.7–4.0)
MCH: 30.1 pg (ref 26.0–34.0)
MCHC: 33.6 g/dL (ref 30.0–36.0)
MCV: 89.5 fL (ref 80.0–100.0)
Monocytes Absolute: 0.3 K/uL (ref 0.1–1.0)
Monocytes Relative: 9 %
Neutro Abs: 2.1 K/uL (ref 1.7–7.7)
Neutrophils Relative %: 54 %
Platelets: 213 K/uL (ref 150–400)
RBC: 4.78 MIL/uL (ref 3.87–5.11)
RDW: 13 % (ref 11.5–15.5)
WBC: 3.8 K/uL — ABNORMAL LOW (ref 4.0–10.5)
nRBC: 0 % (ref 0.0–0.2)

## 2024-05-16 LAB — BASIC METABOLIC PANEL WITH GFR
Anion gap: 13 (ref 5–15)
BUN: 10 mg/dL (ref 8–23)
CO2: 22 mmol/L (ref 22–32)
Calcium: 9.5 mg/dL (ref 8.9–10.3)
Chloride: 103 mmol/L (ref 98–111)
Creatinine, Ser: 0.92 mg/dL (ref 0.44–1.00)
GFR, Estimated: >60 mL/min (ref >60)
Glucose, Bld: 102 mg/dL — ABNORMAL HIGH (ref 70–99)
Potassium: 4 mmol/L (ref 3.5–5.1)
Sodium: 138 mmol/L (ref 135–145)

## 2024-05-16 SURGERY — CYSTOSCOPY/URETEROSCOPY/HOLMIUM LASER/STENT PLACEMENT
Anesthesia: General | Laterality: Left

## 2024-05-16 MED ORDER — OXYCODONE HCL 5 MG PO TABS
ORAL_TABLET | ORAL | Status: AC
  Filled 2024-05-16: qty 1

## 2024-05-16 MED ORDER — CEPHALEXIN 500 MG PO CAPS: 500.0000 mg | ORAL_CAPSULE | Freq: Two times a day (BID) | ORAL | 0 refills | Status: AC

## 2024-05-16 MED ORDER — PHENYLEPHRINE HCL (PRESSORS) 10 MG/ML IV SOLN
INTRAVENOUS | Status: DC | PRN
  Administered 2024-05-16 (×4): 160 ug via INTRAVENOUS

## 2024-05-16 MED ORDER — LIDOCAINE 2% (20 MG/ML) 5 ML SYRINGE
INTRAMUSCULAR | Status: DC | PRN
  Administered 2024-05-16: 40 mg via INTRAVENOUS

## 2024-05-16 MED ORDER — PROPOFOL 500 MG/50ML IV EMUL
INTRAVENOUS | Status: DC | PRN
  Administered 2024-05-16: 25 ug/kg/min via INTRAVENOUS

## 2024-05-16 MED ORDER — FENTANYL CITRATE (PF) 250 MCG/5ML IJ SOLN
INTRAMUSCULAR | Status: DC | PRN
  Administered 2024-05-16: 100 ug via INTRAVENOUS

## 2024-05-16 MED ORDER — ONDANSETRON HCL 4 MG/2ML IJ SOLN
INTRAMUSCULAR | Status: DC | PRN
  Administered 2024-05-16: 4 mg via INTRAVENOUS

## 2024-05-16 MED ORDER — OXYCODONE HCL 5 MG/5ML PO SOLN: 5.0000 mg | Freq: Once | ORAL | Status: AC | PRN

## 2024-05-16 MED ORDER — ACETAMINOPHEN 10 MG/ML IV SOLN
1000.0000 mg | Freq: Once | INTRAVENOUS | Status: DC | PRN
  Administered 2024-05-16: 1000 mg via INTRAVENOUS

## 2024-05-16 MED ORDER — OXYCODONE HCL 5 MG PO TABS
5.0000 mg | ORAL_TABLET | Freq: Once | ORAL | Status: AC | PRN
  Administered 2024-05-16: 5 mg via ORAL

## 2024-05-16 MED ORDER — DEXAMETHASONE SODIUM PHOSPHATE 10 MG/ML IJ SOLN
INTRAMUSCULAR | Status: DC | PRN
  Administered 2024-05-16: 10 mg via INTRAVENOUS

## 2024-05-16 MED ORDER — SODIUM CHLORIDE 0.9 % IR SOLN
Status: DC | PRN
  Administered 2024-05-16: 3000 mL via INTRAVESICAL

## 2024-05-16 MED ORDER — FENTANYL CITRATE PF 50 MCG/ML IJ SOSY
PREFILLED_SYRINGE | INTRAMUSCULAR | Status: AC
  Filled 2024-05-16: qty 1

## 2024-05-16 MED ORDER — FENTANYL CITRATE (PF) 100 MCG/2ML IJ SOLN
INTRAMUSCULAR | Status: AC
Start: 2024-05-16 — End: 2024-05-16
  Filled 2024-05-16: qty 2

## 2024-05-16 MED ORDER — EPHEDRINE SULFATE (PRESSORS) 50 MG/ML IJ SOLN
INTRAMUSCULAR | Status: DC | PRN
  Administered 2024-05-16: 10 mg via INTRAVENOUS

## 2024-05-16 MED ORDER — ACETAMINOPHEN 10 MG/ML IV SOLN
INTRAVENOUS | Status: AC
  Filled 2024-05-16: qty 100

## 2024-05-16 MED ORDER — GENTAMICIN SULFATE 40 MG/ML IJ SOLN
5.0000 mg/kg | INTRAVENOUS | Status: AC
  Administered 2024-05-16: 310 mg via INTRAVENOUS
  Filled 2024-05-16: qty 7.75

## 2024-05-16 MED ORDER — DROPERIDOL 2.5 MG/ML IJ SOLN: 0.6250 mg | Freq: Once | INTRAMUSCULAR | Status: DC | PRN

## 2024-05-16 MED ORDER — FENTANYL CITRATE PF 50 MCG/ML IJ SOSY
25.0000 ug | PREFILLED_SYRINGE | INTRAMUSCULAR | Status: DC | PRN
  Administered 2024-05-16: 50 ug via INTRAVENOUS

## 2024-05-16 MED ORDER — KETOROLAC TROMETHAMINE 10 MG PO TABS: 10.0000 mg | ORAL_TABLET | Freq: Three times a day (TID) | ORAL | 0 refills | Status: AC | PRN

## 2024-05-16 MED ORDER — DEXMEDETOMIDINE HCL IN NACL 80 MCG/20ML IV SOLN
INTRAVENOUS | Status: DC | PRN
Start: 2024-05-16 — End: 2024-05-16
  Administered 2024-05-16: 10 ug via INTRAVENOUS

## 2024-05-16 MED ORDER — PROPOFOL 10 MG/ML IV BOLUS
INTRAVENOUS | Status: DC | PRN
  Administered 2024-05-16: 110 mg via INTRAVENOUS

## 2024-05-16 MED ORDER — IOHEXOL 300 MG/ML  SOLN
INTRAMUSCULAR | Status: DC | PRN
  Administered 2024-05-16: 14 mL via URETHRAL

## 2024-05-16 MED ORDER — LACTATED RINGERS IV SOLN: INTRAVENOUS | Status: DC | PRN

## 2024-05-16 SURGICAL SUPPLY — 19 items
BAG URO CATCHER STRL LF (MISCELLANEOUS) ×1 IMPLANT
BASKET LASER NITINOL 1.9FR (BASKET) IMPLANT
CATH URETL OPEN END 6FR 70 (CATHETERS) ×1 IMPLANT
CLOTH BEACON ORANGE TIMEOUT ST (SAFETY) ×1 IMPLANT
GLOVE SURG LX STRL 7.5 STRW (GLOVE) ×1 IMPLANT
GOWN STRL REUS W/ TWL XL LVL3 (GOWN DISPOSABLE) ×1 IMPLANT
GUIDEWIRE ANG ZIPWIRE 038X150 (WIRE) ×1 IMPLANT
GUIDEWIRE STR DUAL SENSOR (WIRE) ×1 IMPLANT
KIT TURNOVER KIT A (KITS) ×1 IMPLANT
MANIFOLD NEPTUNE II (INSTRUMENTS) ×1 IMPLANT
NS IRRIG 1000ML POUR BTL (IV SOLUTION) IMPLANT
PACK CYSTO (CUSTOM PROCEDURE TRAY) ×1 IMPLANT
SHEATH NAVIGATOR HD 11/13X28 (SHEATH) IMPLANT
SHEATH NAVIGATOR HD 11/13X36 (SHEATH) IMPLANT
STENT POLARIS 5FRX22 (STENTS) IMPLANT
TRACTIP FLEXIVA PULS ID 200XHI (Laser) IMPLANT
TUBE PU 8FR 16IN ENFIT (TUBING) ×1 IMPLANT
TUBING CONNECTING 10 (TUBING) ×1 IMPLANT
TUBING UROLOGY SET (TUBING) ×1 IMPLANT

## 2024-05-16 NOTE — Discharge Instructions (Addendum)
1 - You may have urinary urgency (bladder spasms) and bloody urine on / off with stent in place. This is normal.  2 - Remove tethered stent on Friday morning at home by pulling string, then blue-white plastic tubing, and discarding. Office is open Friday if any problems arise.   3 - Call MD or go to ER for fever >102, severe pain / nausea / vomiting not relieved by medications, or acute change in medical status  

## 2024-05-16 NOTE — Anesthesia Postprocedure Evaluation (Signed)
 Anesthesia Post Note  Patient: Melissa Cole  Procedure(s) Performed: CYSTOSCOPY/URETEROSCOPY/HOLMIUM LASER/STENT PLACEMENT/BASKET STONE REMOVAL (Left) CYSTOSCOPY, WITH RETROGRADE PYELOGRAM (Left)     Patient location during evaluation: PACU Anesthesia Type: General Level of consciousness: awake and alert Pain management: pain level controlled Vital Signs Assessment: post-procedure vital signs reviewed and stable Respiratory status: spontaneous breathing, nonlabored ventilation, respiratory function stable and patient connected to nasal cannula oxygen Cardiovascular status: blood pressure returned to baseline and stable Postop Assessment: no apparent nausea or vomiting Anesthetic complications: no   No notable events documented.  Last Vitals:  Vitals:   05/16/24 1315 05/16/24 1330  BP: 135/70 119/67  Pulse: 92 94  Resp: 10 15  Temp:    SpO2: 98% 99%    Last Pain:  Vitals:   05/16/24 1323  TempSrc:   PainSc: 1                  [name]

## 2024-05-16 NOTE — H&P (Signed)
 Melissa Cole is an 73 y.o. female.    Chief Complaint: Pre-Op LEFT Ureteroscopic Stone Manipulation  HPI:   1 - Recurrent Urolithiasis -  PRe 2025 - SWL and URS x several  04/2024 - 4mm left distal stone and 6mm left UPJ stone with mild hydro by CT. UA without infectious parameters.   PMH sig for mild obesity, HTN. She has a maltese-Yorkie that is her day to day joy. Her PCP is Melissa Neth MD.   Today  Melissa Cole  is seen to proceed with LEFT ureteroscopic stone manipulation.  No interval fevers.   Past Medical History:  Diagnosis Date   Allergy    Atrial fibrillation (HCC)    Basal cell carcinoma    Blood transfusion without reported diagnosis 1969   Cataract    Gallstones    Hepatic cyst    Hypertension    Kidney stones    Mitral valve prolapse    Post-operative nausea and vomiting    Stage 2 chronic kidney disease    Uterine fibroids affecting pregnancy     Past Surgical History:  Procedure Laterality Date   BREAST BIOPSY Left 01/12/2020   BREAST BIOPSY Right 03/01/2024   MM RT BREAST BX W LOC DEV 1ST LESION IMAGE BX SPEC STEREO GUIDE 03/01/2024 [facility]   CHOLECYSTECTOMY  1998   COLONOSCOPY  12/24/2014   CYSTOSCOPY W/ URETERAL STENT PLACEMENT Left 02/06/2023   Procedure: CYSTOSCOPY WITH RETROGRADE PYELOGRAM/URETERAL STENT PLACEMENT;  Surgeon: Roseann Adine PARAS., MD;  Location: [facility];  Service: Urology;  Laterality: Left;   EXTRACORPOREAL SHOCK WAVE LITHOTRIPSY Left 02/04/2023   Procedure: LEFT EXTRACORPOREAL SHOCK WAVE LITHOTRIPSY (ESWL);  Surgeon: Cam Morene ORN, MD;  Location: [facility];  Service: Urology;  Laterality: Left;  75 MINUTES   MOHS SURGERY     basal    POLYPECTOMY     SQUAMOUS CELL CARCINOMA EXCISION     URETEROSCOPY WITH HOLMIUM LASER LITHOTRIPSY Left 02/06/2023   Procedure: URETEROSCOPY WITH HOLMIUM LASER LITHOTRIPSY;  Surgeon: Roseann Adine PARAS., MD;  Location: [facility];  Service: Urology;  Laterality: Left;     Family History  Problem Relation Age of Onset   Heart attack Mother    Allergies Mother        food sensitivities   Heart attack Maternal Uncle        x 2   Stomach cancer Maternal Grandmother    Diabetes Maternal Aunt        great aunt   Diabetes Paternal Aunt    Colon cancer Neg Hx    Esophageal cancer Neg Hx    Rectal cancer Neg Hx    Colon polyps Neg Hx    Social History:  reports that she has never smoked. She has never used smokeless tobacco. She reports that she does not currently use alcohol . She reports that she does not use drugs.  Allergies:  Allergies  Allergen Reactions   Amlodipine Cough    Other reaction(s): OTHER Other reaction(s): OTHER    Benazepril Other (See Comments)    Other reaction(s): RASH Other reaction(s): RASH Other reaction(s): RASH    Codeine Other (See Comments)    Stomach pain    Iodine Itching   Mushroom Ext Cmplx(Shiitake-Reishi-Mait) Nausea Only   Other Nausea Only    Paint,  Cig. smoke   Shiitake Mushroom (Obsolete)    Latex Rash   Oxycodone -Acetaminophen  Nausea Only   Tape Rash    Adhesive Tape    No  medications prior to admission.    No results found for this or any previous visit (from the past 48 hours). CT ABDOMEN PELVIS WO CONTRAST Result Date: 05/14/2024 CLINICAL DATA:  BILATERAL flank pain.  History of nephrolithiasis. EXAM: CT ABDOMEN AND PELVIS WITHOUT CONTRAST TECHNIQUE: Multidetector CT imaging of the abdomen and pelvis was performed following the standard protocol without IV contrast. RADIATION DOSE REDUCTION: This exam was performed according to the departmental dose-optimization program which includes automated exposure control, adjustment of the mA and/or kV according to patient size and/or use of iterative reconstruction technique. COMPARISON:  02/05/2023 FINDINGS: Lower chest: Subpleural cystic changes again seen in the medial RIGHT lower lobe, suspicious for fibrosis. Lung bases otherwise clear.  Hepatobiliary: 7 mm hypodense lesion in the dome of the LEFT hepatic lobe (image 38, series 3) is unchanged dating back to 01/24/2014, which is consistent with a benign etiology, most likely a simple cyst. Liver and bile ducts are otherwise normal in appearance. Status post cholecystectomy. Pancreas: Unremarkable. No pancreatic ductal dilatation or surrounding inflammatory changes. Spleen: Normal in size without focal abnormality. Adrenals/Urinary Tract: Adrenal glands are normal. RIGHT kidney and ureter are normal. 7 mm calculus present within the LEFT renal pelvis without significant hydronephrosis. 7 mm calculus present within the distal LEFT ureter without significant hydroureter. Evaluation of the bladder is limited due to underdistention. It is grossly unremarkable. Stomach/Bowel: Small hiatal hernia. Stomach is within normal limits. Appendix appears normal. No evidence of bowel wall thickening, distention, or inflammatory changes. Vascular/Lymphatic: No enlarged abdominal or pelvic lymph nodes. Scattered calcified atheromatous plaque of the abdominal aorta. Reproductive: Lobulated appearance of the uterus with focal calcification seen in the fundus is consistent with fibroids. No abnormality of the adnexa. Other: No abdominal wall hernia or abnormality. No abdominopelvic ascites. Musculoskeletal: Advanced multilevel degenerative changes of the lumbar spine. No acute osseous abnormality. IMPRESSION: 1. 7 mm calculus within the LEFT renal pelvis without significant hydronephrosis. 2. 7 mm calculus within the distal LEFT ureter without significant hydroureter. 3. RIGHT medial lower lobe subpleural cysts are suspicious for honeycombing and UIP. 4. Fibroid uterus. Electronically Signed   By: Aliene Lloyd M.D.   On: 05/14/2024 13:43    Review of Systems  Constitutional:  Negative for chills and fever.  Genitourinary:  Positive for flank pain.  All other systems reviewed and are negative.   There were no  vitals taken for this visit. Physical Exam Vitals reviewed.  HENT:     Head: Normocephalic.  Eyes:     Pupils: Pupils are equal, round, and reactive to light.  Cardiovascular:     Rate and Rhythm: Normal rate.  Pulmonary:     Effort: Pulmonary effort is normal.  Abdominal:     General: Abdomen is flat.  Genitourinary:    Comments: Mild left CVAT at present Musculoskeletal:        General: Normal range of motion.     Cervical back: Normal range of motion.  Skin:    General: Skin is warm.  Neurological:     General: No focal deficit present.     Mental Status: She is alert.  Psychiatric:        Mood and Affect: Mood normal.      Assessment/Plan  Proceed as planned with LEFT ureteroscopic stone manipulation. Risks, benefits, alternative, expected peri-op course dicussed previously and reiterated today.   Ricardo KATHEE Alvaro Mickey., MD 05/16/2024, 7:27 AM

## 2024-05-16 NOTE — Anesthesia Procedure Notes (Signed)
 Procedure Name: LMA Insertion Date/Time: 05/16/2024 12:15 PM  Performed by: Nanci Riis, CRNAPre-anesthesia Checklist: Patient identified, Emergency Drugs available, Suction available, Patient being monitored and Timeout performed Patient Re-evaluated:Patient Re-evaluated prior to induction Oxygen Delivery Method: Circle system utilized Preoxygenation: Pre-oxygenation with 100% oxygen Induction Type: IV induction LMA: LMA inserted LMA Size: 3.0 Number of attempts: 1 Placement Confirmation: positive ETCO2 and breath sounds checked- equal and bilateral Tube secured with: Tape Dental Injury: Teeth and Oropharynx as per pre-operative assessment

## 2024-05-16 NOTE — Transfer of Care (Signed)
 Immediate Anesthesia Transfer of Care Note  Patient: Melissa Cole  Procedure(s) Performed: CYSTOSCOPY/URETEROSCOPY/HOLMIUM LASER/STENT PLACEMENT/BASKET STONE REMOVAL (Left) CYSTOSCOPY, WITH RETROGRADE PYELOGRAM (Left)  Patient Location: PACU  Anesthesia Type:General  Level of Consciousness: drowsy and patient cooperative  Airway & Oxygen Therapy: Patient Spontanous Breathing  Post-op Assessment: Report given to RN and Post -op Vital signs reviewed and stable  Post vital signs: Reviewed and stable  Last Vitals:  Vitals Value Taken Time  BP 128/60 05/16/24 13:03  Temp    Pulse 116 05/16/24 13:05  Resp 16 05/16/24 13:05  SpO2 97 % 05/16/24 13:05  Vitals shown include unfiled device data.  Last Pain:  Vitals:   05/16/24 1016  TempSrc: Oral         Complications: No notable events documented.

## 2024-05-16 NOTE — Anesthesia Preprocedure Evaluation (Addendum)
 Anesthesia Evaluation  Patient identified by MRN, date of birth, ID band Patient awake    Reviewed: Allergy & Precautions, NPO status , Patient's Chart, lab work & pertinent test results  History of Anesthesia Complications (+) PONV and history of anesthetic complications  Airway Mallampati: II  TM Distance: >3 FB Neck ROM: Full    Dental  (+) Teeth Intact, Dental Advisory Given, Implants   Pulmonary neg pulmonary ROS   breath sounds clear to auscultation       Cardiovascular hypertension,  Rhythm:Regular Rate:Normal  Echo:   1. Left ventricular ejection fraction, by estimation, is 60 to 65%. Left  ventricular ejection fraction by 3D volume is 60 %. The left ventricle has  normal function. The left ventricle has no regional wall motion  abnormalities. Left ventricular diastolic   parameters are consistent with Grade I diastolic dysfunction (impaired  relaxation).   2. Right ventricular systolic function is normal. The right ventricular  size is normal. There is normal pulmonary artery systolic pressure.   3. The mitral valve is grossly normal. Trivial mitral valve  regurgitation. There is mild prolapse of the medial segment of the  anterior leaflet of the mitral valve.   4. The aortic valve is tricuspid. Aortic valve regurgitation is trivial.  No aortic stenosis is present.   5. The inferior vena cava is normal in size with greater than 50%  respiratory variability, suggesting right atrial pressure of 3 mmHg.   6. Evidence of atrial level shunting detected by color flow Doppler;  atrial septam aneursm and flow suggestive of small PFO. Prominent Chiari  network noted.     Neuro/Psych negative neurological ROS  negative psych ROS   GI/Hepatic negative GI ROS, Neg liver ROS,,,  Endo/Other  negative endocrine ROS    Renal/GU Renal disease     Musculoskeletal negative musculoskeletal ROS (+)    Abdominal   Peds   Hematology negative hematology ROS (+)   Anesthesia Other Findings   Reproductive/Obstetrics                              Anesthesia Physical Anesthesia Plan  ASA: 2  Anesthesia Plan: General   Post-op Pain Management: Tylenol  PO (pre-op)*   Induction: Intravenous  PONV Risk Score and Plan: 4 or greater and Ondansetron , Dexamethasone , Treatment may vary due to age or medical condition and Propofol  infusion  Airway Management Planned: LMA  Additional Equipment: None  Intra-op Plan:   Post-operative Plan: Extubation in OR  Informed Consent: I have reviewed the patients History and Physical, chart, labs and discussed the procedure including the risks, benefits and alternatives for the proposed anesthesia with the patient or authorized representative who has indicated his/her understanding and acceptance.     Dental advisory given  Plan Discussed with: CRNA  Anesthesia Plan Comments:          Anesthesia Quick Evaluation

## 2024-05-16 NOTE — Brief Op Note (Signed)
 05/16/2024  12:52 PM  PATIENT:  Melissa Cole  73 y.o. female  PRE-OPERATIVE DIAGNOSIS:  LEFT URETERAL STONES  POST-OPERATIVE DIAGNOSIS:  LEFT URETERAL STONES  PROCEDURE:  Procedure(s): CYSTOSCOPY/URETEROSCOPY/HOLMIUM LASER/STENT PLACEMENT/BASKET STONE REMOVAL (Left) CYSTOSCOPY, WITH RETROGRADE PYELOGRAM (Left)  SURGEON:  Surgeons and Role:    * Manny, Ricardo KATHEE Raddle., MD - Primary  PHYSICIAN ASSISTANT:   ASSISTANTS: none   ANESTHESIA:   general  EBL:  minimal   BLOOD ADMINISTERED:none  DRAINS: none   LOCAL MEDICATIONS USED:  NONE  SPECIMEN:  Source of Specimen:  left ureteral / renal stone fragments  DISPOSITION OF SPECIMEN:  Given to patient  COUNTS:  YES  TOURNIQUET:  * No tourniquets in log *  DICTATION: .Other Dictation: Dictation Number [id number]  PLAN OF CARE: Discharge to home after PACU  PATIENT DISPOSITION:  PACU - hemodynamically stable.   Delay start of Pharmacological VTE agent (>24hrs) due to surgical blood loss or risk of bleeding: not applicable

## 2024-05-17 ENCOUNTER — Encounter (HOSPITAL_COMMUNITY): Payer: Self-pay | Admitting: Urology

## 2024-05-17 NOTE — Op Note (Signed)
 NAME: Melissa Cole, Melissa Cole. MEDICAL RECORD NO: [id number] ACCOUNT NO: [id number] DATE OF BIRTH: 11-26-1950 FACILITY: [facility] LOCATION: [facility] PHYSICIAN: Ricardo Likens, MD  Operative Report   DATE OF PROCEDURE: 05/16/2024  PREOPERATIVE DIAGNOSIS:  Left ureteral and renal stones.  PROCEDURE PERFORMED: 1. Cystoscopy with left retrograde pyelogram interpretation. 2. Left ureteroscopy with laser lithotripsy. 3. Insertion of left ureteral stent.  ESTIMATED BLOOD LOSS:  Nil.  COMPLICATION:  None.  SPECIMENS:  Left renal and ureteral stone fragments given to patient.  FINDINGS: 1.  Left distal third ureteral stone with mild hydronephrosis. 2.  Left lower pole renal stone. 3.  Complete resolution of all accessible stone fragments larger than one third mm on the left side following laser lithotripsy and basket extraction. 4.  Successful placement of left ureteral stent, proximal end in renal pelvis, distal end in the urinary bladder with tether.  INDICATIONS:  The patient is a pleasant 73 year old lady with a history of recurrent ureterolithiasis.  She has found on workup of colicky flank pain to have a left distal ureteral stone as well as a larger left renal stone.  Options discussed for  management including medical therapy versus shock wave lithotripsy versus ureteroscopy with the goal of stone free and she wished to proceed with the latter.  Informed consent was obtained and placed in medical record.  DESCRIPTION OF PROCEDURE:  The patient was identified and verified.  Procedure being left ureteroscopic stone manipulation was confirmed.  Time out was performed.  Intravenous antibiotics administered. General LMA anesthesia was introduced.  The patient  was placed into a low lithotomy position.  Sterile fields were created prepping and draping the patient's vagina, introitus, and proximal thigh using iodine.  Cystourethroscopy performed using a 21-French rigid cystoscope with offset lens.   Inspection of  the urinary bladder revealed no diverticula, calcifications, or papillary lesions.  The left ureteral orifice was cannulated using a 6-French end-hole catheter and left retrograde pyelogram was obtained.  Left retrograde pyelogram demonstrated a single left ureteral, single system left kidney.  There was a questionable filling defect in the distal ureter and mild hydronephrosis above this likely consistent with known stone.  A 0.038 ZIPwire was advanced  at the level of the upper pole.  Set aside as a safety wire.  An 8-French feeding tube was placed in the urinary bladder for pressure release.  A semi-rigid ureteroscopy was performed in the distal left ureter alongside as a separate sensory working  wire.  As anticipated, there was, in fact, a left distal ureteral stone approximately 3 cm above the intramural ureter.  This was very fusiform and did appear amenable to simple basketing.  It was grasped on the long axis, very carefully navigated out in  its entirety set aside to be given to the patient.  A semirigid ureteroscopy was then performed above this level inspecting the entire distal four-fifths left ureter and the semi-rigid scope was exchanged via a short-length ureteral access sheath to the  level of the proximal ureter using continuous fluoroscopic guidance and flexible digital ureteroscopy was performed on the proximal left ureter. Systematic inspection of the left kidney including all calyces x3.  There was a dominant calcification noted  in the lower pole infundibular area.  This was too large for simple basketing.  As such, Holmium laser energy was placed on stone at setting of 0.2 joules and 20 Hz.  It was fragmented in approximately four smaller pieces that were then each amenable to  simple basketing with escape  basket.  Following this, complete resolution of the all accessible stone fragments larger than one third mm.  Excellent hemostasis.  No evidence of perforation.   Access sheath was removed under continuous vision.  No  significant mucosal abnormalities were found.  There was some mild mucosal edema at the prior site of distal stone impaction.  Given this, an access sheath usage was without brief interval stenting with a tethered stent would be most prudent and a new 5  x 22 Polaris-type stent was carefully placed with a safety wire using fluoroscopic guidance.  Good proximal and distal plane were noted.  Tether was trimmed to length and tucked in her vagina.  The procedure was terminated.  The patient tolerated the  procedure well and no immediate periprocedural complications.  The patient was taken to post anesthesia care unit in stable condition.  Plan for discharge home.   PUS D: 05/16/2024 12:57:55 pm T: 05/17/2024 3:30:00 am  JOB: [id number]/ [id number]

## 2024-05-18 DIAGNOSIS — N202 Calculus of kidney with calculus of ureter: Secondary | ICD-10-CM | POA: Diagnosis not present

## 2024-05-19 ENCOUNTER — Other Ambulatory Visit: Payer: Self-pay

## 2024-05-19 ENCOUNTER — Encounter (HOSPITAL_COMMUNITY): Payer: Self-pay

## 2024-05-19 ENCOUNTER — Emergency Department (HOSPITAL_COMMUNITY)
Admission: EM | Admit: 2024-05-19 | Discharge: 2024-05-19 | Discharge status: Against Medical Advice | Attending: Emergency Medicine | Admitting: Emergency Medicine

## 2024-05-19 DIAGNOSIS — R1030 Lower abdominal pain, unspecified: Secondary | ICD-10-CM | POA: Insufficient documentation

## 2024-05-19 DIAGNOSIS — R1110 Vomiting, unspecified: Secondary | ICD-10-CM | POA: Insufficient documentation

## 2024-05-19 DIAGNOSIS — Z5321 Procedure and treatment not carried out due to patient leaving prior to being seen by health care provider: Secondary | ICD-10-CM | POA: Diagnosis not present

## 2024-05-19 LAB — CBC WITH DIFFERENTIAL/PLATELET
Abs Immature Granulocytes: 0.03 K/uL (ref 0.00–0.07)
Basophils Absolute: 0.1 K/uL (ref 0.0–0.1)
Basophils Relative: 1 %
Eosinophils Absolute: 0 K/uL (ref 0.0–0.5)
Eosinophils Relative: 0 %
HCT: 41 % (ref 36.0–46.0)
Hemoglobin: 13.8 g/dL (ref 12.0–15.0)
Immature Granulocytes: 0 %
Lymphocytes Relative: 21 %
Lymphs Abs: 1.7 K/uL (ref 0.7–4.0)
MCH: 30.1 pg (ref 26.0–34.0)
MCHC: 33.7 g/dL (ref 30.0–36.0)
MCV: 89.3 fL (ref 80.0–100.0)
Monocytes Absolute: 0.7 K/uL (ref 0.1–1.0)
Monocytes Relative: 9 %
Neutro Abs: 5.4 K/uL (ref 1.7–7.7)
Neutrophils Relative %: 69 %
Platelets: 205 K/uL (ref 150–400)
RBC: 4.59 MIL/uL (ref 3.87–5.11)
RDW: 13 % (ref 11.5–15.5)
WBC: 7.9 K/uL (ref 4.0–10.5)
nRBC: 0 % (ref 0.0–0.2)

## 2024-05-19 LAB — URINALYSIS, ROUTINE W REFLEX MICROSCOPIC
Bilirubin Urine: NEGATIVE
Glucose, UA: NEGATIVE mg/dL
Ketones, ur: NEGATIVE mg/dL
Nitrite: NEGATIVE
Protein, ur: 100 mg/dL — AB
Specific Gravity, Urine: 1.005 (ref 1.005–1.030)
WBC, UA: >50 WBC/hpf (ref 0–5)
pH: 6 (ref 5.0–8.0)

## 2024-05-19 LAB — BASIC METABOLIC PANEL WITH GFR
Anion gap: 11 (ref 5–15)
BUN: 16 mg/dL (ref 8–23)
CO2: 23 mmol/L (ref 22–32)
Calcium: 9.5 mg/dL (ref 8.9–10.3)
Chloride: 101 mmol/L (ref 98–111)
Creatinine, Ser: 1.34 mg/dL — ABNORMAL HIGH (ref 0.44–1.00)
GFR, Estimated: 42 mL/min — ABNORMAL LOW (ref >60)
Glucose, Bld: 99 mg/dL (ref 70–99)
Potassium: 5 mmol/L (ref 3.5–5.1)
Sodium: 135 mmol/L (ref 135–145)

## 2024-05-19 MED ORDER — ONDANSETRON 4 MG PO TBDP
4.0000 mg | ORAL_TABLET | Freq: Once | ORAL | Status: AC
  Administered 2024-05-19: 4 mg via ORAL
  Filled 2024-05-19: qty 1

## 2024-05-19 MED ORDER — HYDROCODONE-ACETAMINOPHEN 5-325 MG PO TABS
1.0000 | ORAL_TABLET | Freq: Once | ORAL | Status: AC
  Administered 2024-05-19: 1 via ORAL
  Filled 2024-05-19: qty 1

## 2024-05-19 NOTE — ED Notes (Signed)
 Pt reports that she went to the bathroom and possibly passed a kidney stone and started to feel better. Pt stated that she was going to leave and return to the ED if any pain returns.

## 2024-05-19 NOTE — ED Triage Notes (Signed)
 Patient had 2 kidney stones removed on Wednesday took stent out Friday and reports pain is worse in left flank with lower abd cramping.  Patient reports she gave herself two water enemas and having very little stool. Reports vomiting last night for 3 hours

## 2024-05-29 ENCOUNTER — Encounter: Payer: Self-pay | Admitting: Internal Medicine

## 2024-05-29 ENCOUNTER — Ambulatory Visit: Admitting: Internal Medicine

## 2024-05-29 VITALS — BP 156/73 | HR 95 | Temp 98.5°F | Ht 64.0 in | Wt 159.2 lb

## 2024-05-29 DIAGNOSIS — R918 Other nonspecific abnormal finding of lung field: Secondary | ICD-10-CM | POA: Diagnosis not present

## 2024-05-29 NOTE — Patient Instructions (Addendum)
 ICD-10-CM   1. Interstitial lung abnormality (ILA)  R91.8       There is Right lower lobe Interstitl  Lung Abnormality on CT abd lung image in 2025 and preesnt in 2022 but absent in 2015 Possible the wood and lawn exposure issue you had 10 years ago caused it  Plan  - do blood work for [employer], RF, [facility] - best to keep an eye on things  -do full PFT in 6 months  - do HRCT in 6 months  - With general pulmonary hygiene advice  - get rid of all DOWN elements in the house [Duhe, pillow, jacket] -this can cause a type of condition called hypersensitivity pneumonitis - Ensure you are up-to-date with respiratory vaccines - Avoid sick contacts and mask and clustered spaces  ROV  - APP in  6months to discuss results - if stable/no cause/no issues -> continued monitoring with breating tests and occassinal CT is indicated

## 2024-05-29 NOTE — Progress Notes (Signed)
 OV 05/29/2024  Subjective:  Patient ID: Melissa Cole, female , DOB: [birth year] , age 73 y.o. , MRN: [id number] , ADDRESS: [address] [place] [address] PCP Shayne Anes, MD Patient Care Team: Shayne Anes, MD as PCP - General (Internal Medicine) Alvan Ronal BRAVO, MD (Inactive) as PCP - Cardiology (Cardiology)  This Provider for this visit: Treatment Team:  Attending Provider: Geronimo Amel, MD    05/29/2024 -   Chief Complaint  Patient presents with   Consult    Pulm. Fibrosis consult Pt states she doesn't get SOB any more than I already have     HPI Melissa Cole 73 y.o. -  [employer] [facility] Comprehensive ILD Questionnaire  Symptoms:  REfereed by PCP Shayne Anes, MD    Patient reports that there are incidental findings on the CT scan discovered at the time of a kidney stone issues 2 years ago and then most recently this year.  Therefore this referral has been made.  From a respiratory standpoint she is essentially asymptomatic.  In talking to her and reviewed the medical records she grew up in [place], [place]  where she was still age 14 years of life.  [place]endemic area for histoplasmosis] then 53 years ago she moved to [place]  where she then worked at [employer].  20 years ago she relocated to [facility] to be with her daughter and grandchildren.  She says around the time she left [place]  [2005] she had a chest x-ray that showed spots.  I personally visualized this November 2005 chest x-ray and it looks normal to me maybe there are some histo nodules.  Pulmonologist I repeated chest x-ray which I also visualized and it looks normal to me she was reassured and then she moved here  10 years after moving here which is approximately 10 years ago 1 day while she was doing some wood turning and also mowing the yard she developed what sounds like Raynaud's-like symptoms with coughing and chest redness and she needed to course of  prednisone and then same time same year she also had kidney stone issues open.  CT abdomen lung images at the time shows bilateral bibasal atelectasis in the subpleural area.  Then in 2022 she had cardiac CT this again shows the right lower lobe subpleural densities with some reticulation.  There is a first time this.  This was absent in 2015.  She had a CT abdomen lung image at the time of kidney stone in 2024 and also 2025 and it shows the same thing.  I do not personally think there is any change  Risk factors - No history of radiation - 10 years ago history of Raynaud's-like symptoms - No acid reflux - No smoking - Bird exposure: She had parakeets when she was a child but nothing since she left [place] . - She does use  SYMPTOM SCALE - ILD 05/29/2024  Current weight   O2 use ra  Shortness of Breath 0 -> 5 scale with 5 being worst (score 6 If unable to do)  At rest 0  Simple tasks - showers, clothes change, eating, shaving 0  Household (dishes, doing bed, laundry) 0  Shopping 0  Walking level at own pace 0  Walking up Stairs 1  Total (30-36) Dyspnea Score 1      Non-dyspnea symptoms (0-> 5 scale) 05/29/2024  How bad is your cough? 0  How bad is your fatigue 0  How bad  is nausea 0  How bad is vomiting?  0  How bad is diarrhea? 0  How bad is anxiety? 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 Coronary calcium  score of 471 is at the 92nd percentile for the patient's age, sex and race. 2. Posterior right lower lobe scarring and mild bronchiectasis, likely the residua of prior infection.     Electronically Signed   By: Marcey Moan M.D.   On: 07/13/2021 14:58    Xxxxxx CT abd Agu 2025   COMPARISON:  02/05/2023   FINDINGS: Lower chest: Subpleural cystic changes again seen in the medial RIGHT lower lobe, suspicious for fibrosis. Lung bases otherwise clear.  PFT      No data to display               SIT STAND TEST - goal 15 times   05/29/2024    O2 used ra   PRobe - finter or forehead finger   Number sit and stand completed - goal 15 15   Time taken to complete 25 sec   Resting Pulse Ox/HR/Dyspnea    98% and 101/min and dyspnea of 0/10    Peak measures 98 % and 125/min and dyspnea of 0/10   Final Pulse Ox/HR 98% and 103/min and dyspnea of 0/10   Desaturated </= 88% no   Desaturated <= 3% points no   Got Tachycardic >/= 90/min yes   Miscellaneous comments no      LAB RESULTS last 96 hours No results found.       has a past medical history of Allergy, Atrial fibrillation (HCC), Basal cell carcinoma, Blood transfusion without reported diagnosis (1969), Cataract, Gallstones, Hepatic cyst, Hypertension, Kidney stones, Mitral valve prolapse, Post-operative nausea and vomiting, Stage 2 chronic kidney disease, and Uterine fibroids affecting pregnancy.   reports that she has never smoked. She has never used smokeless tobacco.  Past Surgical History:  Procedure Laterality Date   BREAST BIOPSY Left 01/12/2020   BREAST BIOPSY Right 03/01/2024   MM RT BREAST BX W LOC DEV 1ST LESION IMAGE BX SPEC STEREO GUIDE 03/01/2024 [facility]   CHOLECYSTECTOMY  1998   COLONOSCOPY  12/24/2014   CYSTOSCOPY W/ RETROGRADES Left 05/16/2024   Procedure: CYSTOSCOPY, WITH  RETROGRADE PYELOGRAM;  Surgeon: Alvaro Ricardo KATHEE Mickey., MD;  Location: [facility];  Service: Urology;  Laterality: Left;   CYSTOSCOPY W/ URETERAL STENT PLACEMENT Left 02/06/2023   Procedure: CYSTOSCOPY WITH RETROGRADE PYELOGRAM/URETERAL STENT PLACEMENT;  Surgeon: Roseann Adine PARAS., MD;  Location: [facility];  Service: Urology;  Laterality: Left;   CYSTOSCOPY/URETEROSCOPY/HOLMIUM LASER/STENT PLACEMENT Left 05/16/2024   Procedure: CYSTOSCOPY/URETEROSCOPY/HOLMIUM LASER/STENT PLACEMENT/BASKET STONE REMOVAL;  Surgeon: Alvaro Ricardo KATHEE Mickey., MD;  Location: [facility];  Service: Urology;  Laterality: Left;   EXTRACORPOREAL SHOCK WAVE LITHOTRIPSY Left 02/04/2023   Procedure: LEFT EXTRACORPOREAL SHOCK WAVE LITHOTRIPSY (ESWL);  Surgeon: Cam Morene ORN, MD;  Location: [facility];  Service: Urology;  Laterality: Left;  75 MINUTES   MOHS SURGERY     basal    POLYPECTOMY     SQUAMOUS CELL CARCINOMA EXCISION     URETEROSCOPY WITH HOLMIUM LASER LITHOTRIPSY Left 02/06/2023   Procedure: URETEROSCOPY WITH HOLMIUM LASER LITHOTRIPSY;  Surgeon: Roseann Adine PARAS., MD;  Location: [facility];  Service: Urology;  Laterality: Left;    Allergies  Allergen Reactions   Amlodipine Cough    Other reaction(s): OTHER Other reaction(s): OTHER    Benazepril Other (See Comments)    Other reaction(s): RASH Other reaction(s): RASH Other reaction(s): RASH    Codeine Other (See Comments)    Stomach pain    Iodine Itching   Mushroom Ext Cmplx(Shiitake-Reishi-Mait) Nausea Only   Other Nausea Only    Paint,  Cig. smoke   Shiitake Mushroom (Obsolete)    Latex Rash   Oxycodone -Acetaminophen  Nausea Only   Tape Rash    Adhesive Tape     There is no immunization history on file for this patient.  Family History  Problem Relation Age of Onset   Heart attack Mother    Allergies Mother        food sensitivities   Heart attack Maternal Uncle        x 2   Stomach cancer Maternal Grandmother    Diabetes  Maternal Aunt        great aunt   Diabetes Paternal Aunt    Colon cancer Neg Hx    Esophageal cancer Neg Hx    Rectal cancer Neg Hx    Colon polyps Neg Hx      Current Outpatient Medications:    Cholecalciferol (VITAMIN D3 PO), Take 1 tablet by mouth every other day., Disp: , Rfl:    co-enzyme Q-10 30 MG capsule, Take 30 mg by mouth daily., Disp: , Rfl:    Cyanocobalamin  (VITAMIN B-12 PO), Take 3,000 mcg by mouth every other day., Disp: , Rfl:    estradiol-norethindrone (ACTIVELLA) 1-0.5 MG tablet, Take 1 tablet by mouth every other day., Disp: , Rfl:    furosemide (LASIX) 20 MG tablet, Take 20 mg by mouth daily as needed., Disp: , Rfl:    HYDROcodone -acetaminophen  (NORCO/VICODIN) 5-325 MG tablet, Take 1 tablet by mouth every 6 (six) hours as needed for moderate pain., Disp: , Rfl:    Multiple Vitamin (MULTIVITAMIN) LIQD, Take 5 mLs by mouth daily., Disp: , Rfl:    ondansetron  (ZOFRAN ) 4 MG tablet, Take 1 tablet (4 mg total) by mouth every 8 (eight) hours as needed for nausea or vomiting., Disp: 2 tablet, Rfl: 0   OVER THE COUNTER MEDICATION, Take 2 capsules by mouth daily. Ultimate Oil (fish oil supplement) 1280 mg + CoQQ 10, Disp: , Rfl:    potassium chloride  (KLOR-CON ) 10 MEQ tablet, Take 10 mEq by mouth daily. (Patient taking differently: Take 10 mEq by mouth daily.), Disp: , Rfl:    rosuvastatin  (CRESTOR ) 5 MG tablet, Take 5 mg by mouth daily., Disp: , Rfl:    telmisartan (MICARDIS) 40 MG tablet, Take 40 mg by mouth at bedtime., Disp: , Rfl:    cephALEXin  (KEFLEX ) 500 MG capsule, Take 1 capsule (500 mg total) by mouth 2 (two) times daily. X 3 days to prevent infection with tethered stent, Disp: 6 capsule,  Rfl: 0   ketorolac  (TORADOL ) 10 MG tablet, Take 1 tablet (10 mg total) by mouth every 8 (eight) hours as needed for moderate pain (pain score 4-6) (or stent discomfort post-operatively)., Disp: 20 tablet, Rfl: 0   multivitamin-lutein (OCUVITE-LUTEIN) CAPS capsule, Take 1 capsule by  mouth daily. (Patient not taking: Reported on 05/29/2024), Disp: , Rfl:    Sod Picosulfate-Mag Ox-Cit Acd (CLENPIQ ) 10-3.5-12 MG-GM -GM/160ML SOLN, Take 1 kit by mouth as directed., Disp: 320 mL, Rfl: 0      Objective:   Vitals:   05/29/24 0929  BP: (!) 156/73  Pulse: 95  Temp: 98.5 F (36.9 C)  SpO2: 97%  Weight: 159 lb 3.2 oz (72.2 kg)  Height: 5' 4 (1.626 m)    Estimated body mass index is 27.33 kg/m as calculated from the following:   Height as of this encounter: 5' 4 (1.626 m).   Weight as of this encounter: 159 lb 3.2 oz (72.2 kg).  @WEIGHTCHANGE @  [employer]   05/29/24 0929  Weight: 159 lb 3.2 oz (72.2 kg)     Physical Exam   General: No distress. Looks well O2 at rest: no Cane present: no Sitting in wheel chair: no Frail: no Obese: no Neuro: Alert and Oriented x 3. GCS 15. Speech normal Psych: Pleasant Resp:  Barrel Chest - no.  Wheeze - no, Crackles - no, No overt respiratory distress CVS: Normal heart sounds. Murmurs - no Ext: Stigmata of Connective Tissue Disease - no HEENT: Normal upper airway. PEERL +. No post nasal drip        Assessment/     Assessment & Plan Interstitial lung abnormality (ILA)    PLAN Patient Instructions     ICD-10-CM   1. Interstitial lung abnormality (ILA)  R91.8       There is Right lower lobe Interstitl  Lung Abnormality on CT abd lung image in 2025 and preesnt in 2022 but absent in 2015 Possible the wood and lawn exposure issue you had 10 years ago caused it  Plan  - do blood work for [employer], RF, CCP - best to keep an eye on things  -do full PFT in 6 months  - do HRCT in 6 months  - With general pulmonary hygiene advice  - get rid of all DOWN elements in the house [Duhe, pillow, jacket] -this can cause a type of condition called hypersensitivity pneumonitis - Ensure you are up-to-date with respiratory vaccines - Avoid sick contacts and mask and clustered spaces  ROV  - APP in  6months to discuss  results - if stable/no cause/no issues -> continued monitoring with breating tests and occassinal CT is indicated    FOLLOWUP    No follow-ups on file.    SIGNATURE    Dr. Dorethia Cave, M.D., F.C.C.P,  Pulmonary and Critical Care Medicine Staff Physician, [facility] Center Director - Interstitial Lung Disease  Program  Pulmonary Fibrosis [facility] at [facility] [place], [place], [address]  Pager: [phone], If no answer or between  15:00h - 7:00h: call [phone] Telephone: [phone]  10:14 AM 05/29/2024

## 2024-05-30 LAB — CYCLIC CITRUL PEPTIDE ANTIBODY, IGG: Cyclic Citrullin Peptide Ab: <16 U

## 2024-05-30 LAB — RHEUMATOID FACTOR: Rheumatoid fact SerPl-aCnc: <10 [IU]/mL (ref <14)

## 2024-05-30 LAB — ANA: Anti Nuclear Antibody (ANA): NEGATIVE

## 2024-05-31 DIAGNOSIS — N202 Calculus of kidney with calculus of ureter: Secondary | ICD-10-CM | POA: Diagnosis not present

## 2024-06-29 ENCOUNTER — Ambulatory Visit: Admitting: Internal Medicine

## 2024-07-18 DIAGNOSIS — L570 Actinic keratosis: Secondary | ICD-10-CM | POA: Diagnosis not present

## 2024-07-18 DIAGNOSIS — Z85828 Personal history of other malignant neoplasm of skin: Secondary | ICD-10-CM | POA: Diagnosis not present

## 2024-07-18 DIAGNOSIS — L814 Other melanin hyperpigmentation: Secondary | ICD-10-CM | POA: Diagnosis not present

## 2024-07-18 DIAGNOSIS — Z08 Encounter for follow-up examination after completed treatment for malignant neoplasm: Secondary | ICD-10-CM | POA: Diagnosis not present

## 2024-07-18 DIAGNOSIS — D1801 Hemangioma of skin and subcutaneous tissue: Secondary | ICD-10-CM | POA: Diagnosis not present

## 2024-07-23 DIAGNOSIS — R0981 Nasal congestion: Secondary | ICD-10-CM | POA: Diagnosis not present

## 2024-07-23 DIAGNOSIS — J309 Allergic rhinitis, unspecified: Secondary | ICD-10-CM | POA: Diagnosis not present

## 2024-07-23 DIAGNOSIS — Z1152 Encounter for screening for COVID-19: Secondary | ICD-10-CM | POA: Diagnosis not present

## 2024-07-23 DIAGNOSIS — R918 Other nonspecific abnormal finding of lung field: Secondary | ICD-10-CM | POA: Diagnosis not present

## 2024-07-23 DIAGNOSIS — J0100 Acute maxillary sinusitis, unspecified: Secondary | ICD-10-CM | POA: Diagnosis not present

## 2024-09-03 ENCOUNTER — Ambulatory Visit
Admission: RE | Admit: 2024-09-03 | Discharge: 2024-09-03 | Disposition: A | Source: Ambulatory Visit | Attending: Internal Medicine | Admitting: Internal Medicine

## 2024-09-03 DIAGNOSIS — N6489 Other specified disorders of breast: Secondary | ICD-10-CM

## 2024-09-06 ENCOUNTER — Other Ambulatory Visit: Payer: Self-pay | Admitting: Internal Medicine

## 2024-09-06 DIAGNOSIS — Z1231 Encounter for screening mammogram for malignant neoplasm of breast: Secondary | ICD-10-CM

## 2024-09-16 ENCOUNTER — Ambulatory Visit: Payer: Self-pay | Admitting: Internal Medicine

## 2024-09-16 NOTE — Progress Notes (Signed)
 Autoimmune profile normal. Await CT chest in Marhc 2026

## 2024-11-21 ENCOUNTER — Other Ambulatory Visit

## 2025-02-12 ENCOUNTER — Ambulatory Visit
